# Patient Record
Sex: Female | Born: 1940 | Race: Black or African American | Hispanic: No | Marital: Single | Smoking: Current every day smoker
Health system: Southern US, Community
[De-identification: ages and names within clinical notes are randomized; demographics above are authoritative.]

## PROBLEM LIST (undated history)

## (undated) DIAGNOSIS — J449 Chronic obstructive pulmonary disease, unspecified: Secondary | ICD-10-CM

## (undated) DIAGNOSIS — C169 Malignant neoplasm of stomach, unspecified: Secondary | ICD-10-CM

## (undated) DIAGNOSIS — H25019 Cortical age-related cataract, unspecified eye: Secondary | ICD-10-CM

## (undated) DIAGNOSIS — Z86010 Personal history of colonic polyps: Secondary | ICD-10-CM

## (undated) DIAGNOSIS — N189 Chronic kidney disease, unspecified: Secondary | ICD-10-CM

## (undated) DIAGNOSIS — M1990 Unspecified osteoarthritis, unspecified site: Secondary | ICD-10-CM

## (undated) DIAGNOSIS — M654 Radial styloid tenosynovitis [de Quervain]: Secondary | ICD-10-CM

## (undated) DIAGNOSIS — C801 Malignant (primary) neoplasm, unspecified: Secondary | ICD-10-CM

## (undated) DIAGNOSIS — M4800 Spinal stenosis, site unspecified: Secondary | ICD-10-CM

## (undated) DIAGNOSIS — K859 Acute pancreatitis without necrosis or infection, unspecified: Secondary | ICD-10-CM

## (undated) DIAGNOSIS — N200 Calculus of kidney: Secondary | ICD-10-CM

## (undated) DIAGNOSIS — D649 Anemia, unspecified: Secondary | ICD-10-CM

## (undated) HISTORY — PX: OTHER SURGICAL HISTORY: SHX169

## (undated) HISTORY — PX: ABDOMINAL HYSTERECTOMY: SHX81

## (undated) HISTORY — PX: BLADDER SURGERY: SHX569

## (undated) HISTORY — PX: WRIST SURGERY: SHX841

## (undated) HISTORY — PX: SHOULDER ACROMIOPLASTY: SHX6093

---

## 1987-07-07 DIAGNOSIS — C801 Malignant (primary) neoplasm, unspecified: Secondary | ICD-10-CM

## 1987-07-07 DIAGNOSIS — C169 Malignant neoplasm of stomach, unspecified: Secondary | ICD-10-CM

## 1987-07-07 HISTORY — DX: Malignant (primary) neoplasm, unspecified: C80.1

## 1987-07-07 HISTORY — DX: Malignant neoplasm of stomach, unspecified: C16.9

## 2005-01-15 ENCOUNTER — Emergency Department: Payer: Self-pay | Admitting: Emergency Medicine

## 2005-01-27 ENCOUNTER — Ambulatory Visit: Payer: Self-pay | Admitting: Family Medicine

## 2005-08-25 ENCOUNTER — Ambulatory Visit: Payer: Self-pay | Admitting: Family Medicine

## 2006-02-16 ENCOUNTER — Ambulatory Visit: Payer: Self-pay | Admitting: Family Medicine

## 2006-08-30 ENCOUNTER — Ambulatory Visit: Payer: Self-pay | Admitting: Family Medicine

## 2006-09-30 ENCOUNTER — Ambulatory Visit: Payer: Self-pay | Admitting: Unknown Physician Specialty

## 2007-01-10 ENCOUNTER — Other Ambulatory Visit: Payer: Self-pay

## 2007-01-10 ENCOUNTER — Inpatient Hospital Stay: Payer: Self-pay | Admitting: Internal Medicine

## 2007-12-20 ENCOUNTER — Ambulatory Visit: Payer: Self-pay | Admitting: Specialist

## 2008-02-17 ENCOUNTER — Other Ambulatory Visit: Payer: Self-pay

## 2008-02-17 ENCOUNTER — Ambulatory Visit: Payer: Self-pay | Admitting: Specialist

## 2008-02-28 ENCOUNTER — Ambulatory Visit: Payer: Self-pay | Admitting: Specialist

## 2009-01-01 ENCOUNTER — Ambulatory Visit: Payer: Self-pay | Admitting: Family Medicine

## 2009-02-14 ENCOUNTER — Encounter: Admission: RE | Admit: 2009-02-14 | Discharge: 2009-02-14 | Payer: Self-pay | Admitting: Neurosurgery

## 2009-12-05 ENCOUNTER — Ambulatory Visit: Payer: Self-pay | Admitting: Unknown Physician Specialty

## 2010-07-14 ENCOUNTER — Ambulatory Visit: Payer: Self-pay | Admitting: Family Medicine

## 2011-05-12 ENCOUNTER — Emergency Department: Payer: Self-pay | Admitting: Unknown Physician Specialty

## 2011-06-28 ENCOUNTER — Emergency Department: Payer: Self-pay | Admitting: Internal Medicine

## 2011-08-13 ENCOUNTER — Ambulatory Visit: Payer: Self-pay | Admitting: Family Medicine

## 2012-02-08 IMAGING — MG MM CAD SCREENING MAMMO
1 series · 4 of 4 positions shown · non-contrast
Comparison: 07/14/2010, 08/30/2006.

REASON FOR EXAM: SCR MAMMO NO ORDER
COMMENTS:

PROCEDURE:     MAM - MAM DGTL SCRN MAM NO ORDER W/CAD  - August 13, 2011  [DATE]
RESULT:

[R CC · right · 4 of 4 slices shown]
[im 1/4]
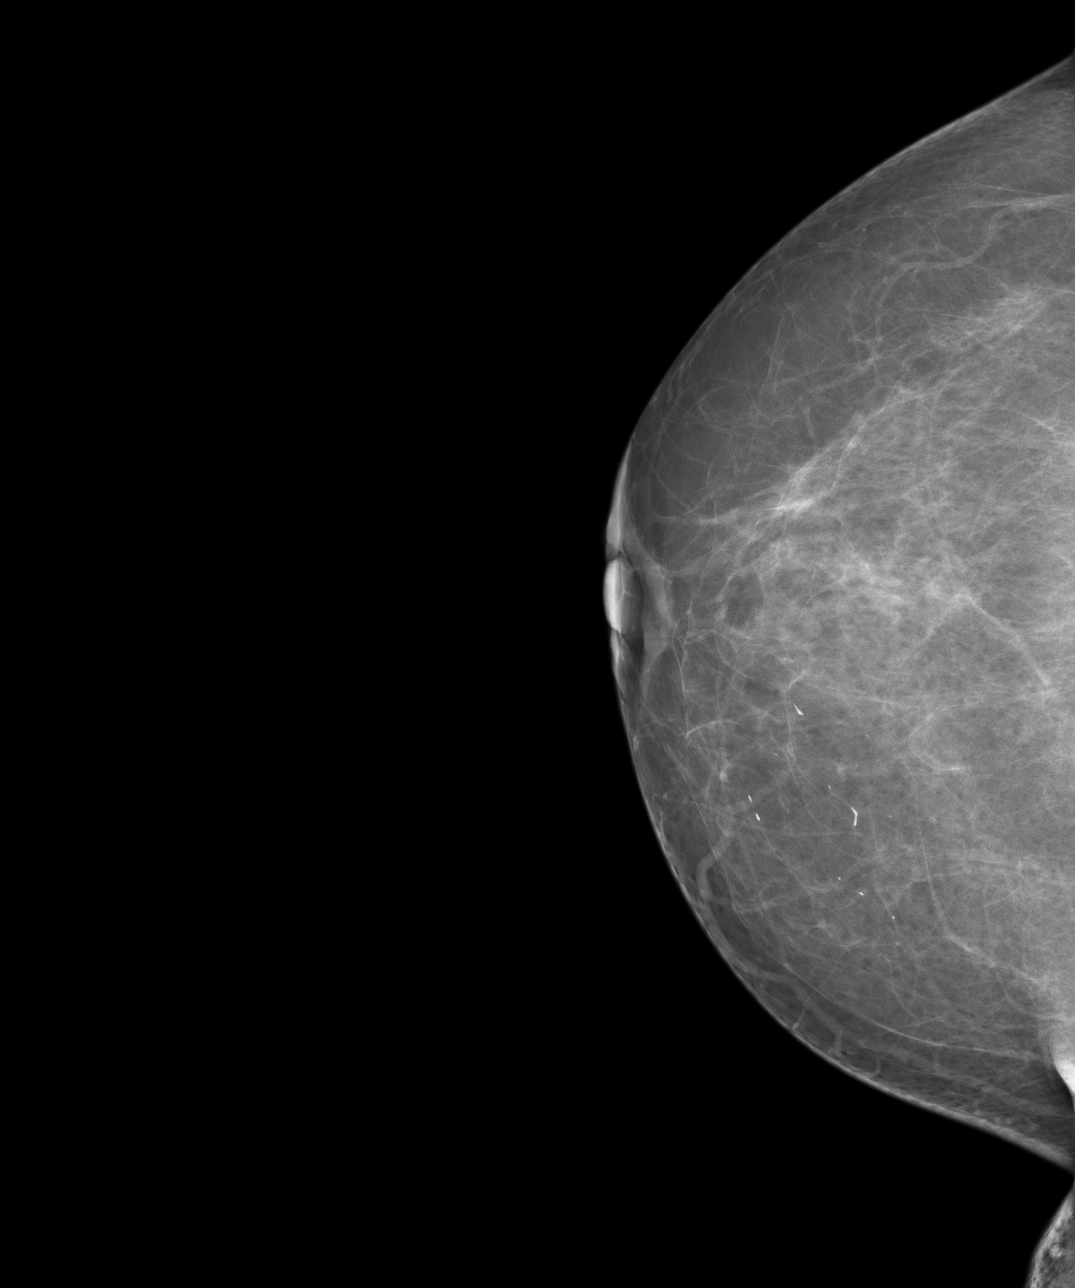
[im 2/4]
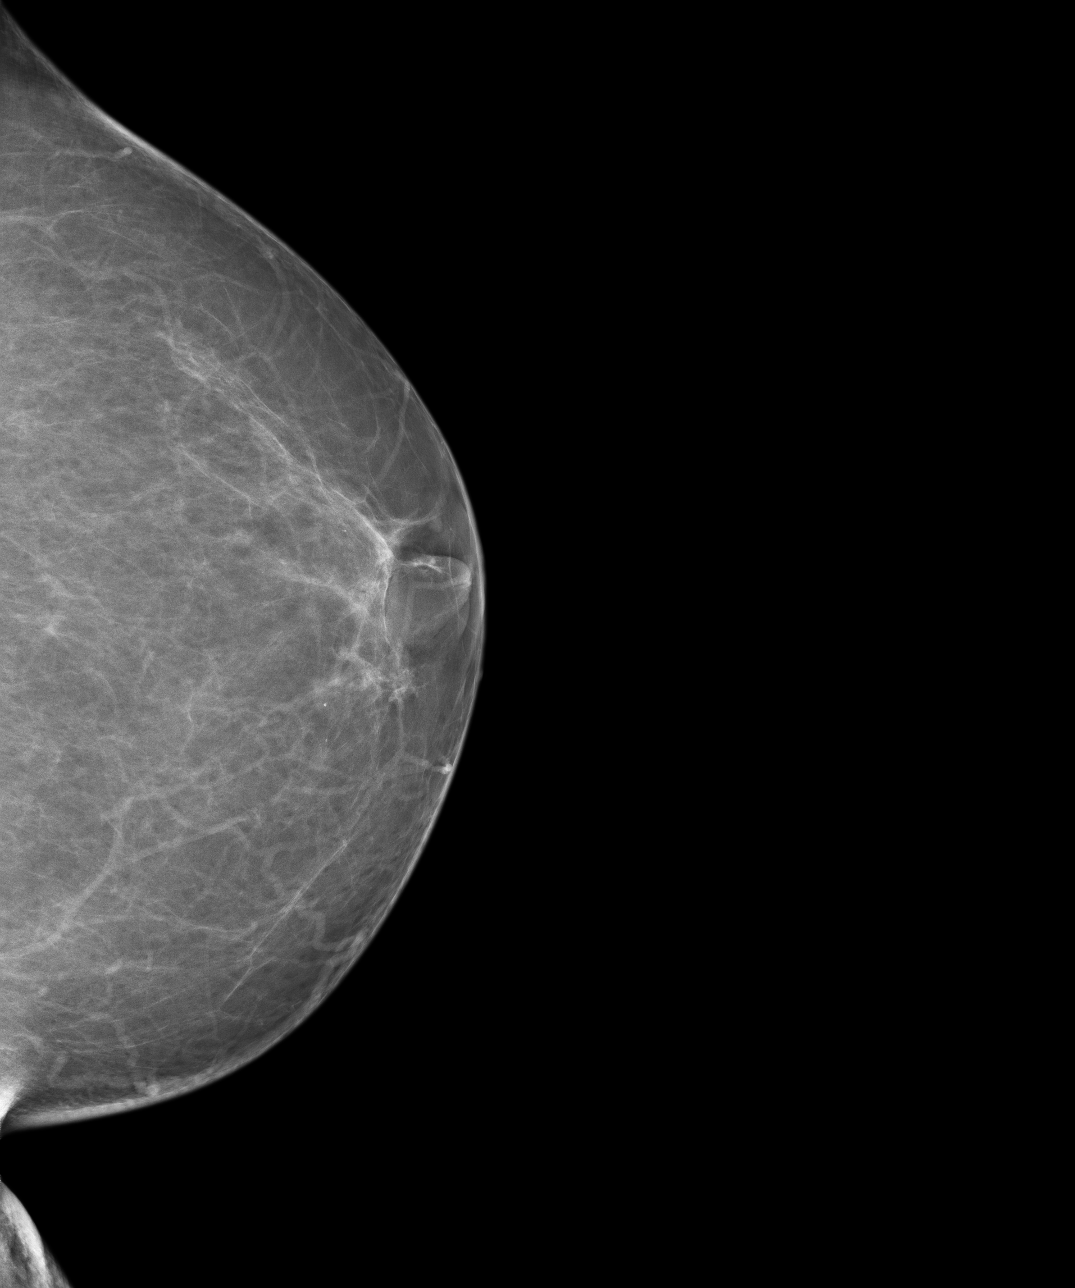
[im 3/4]
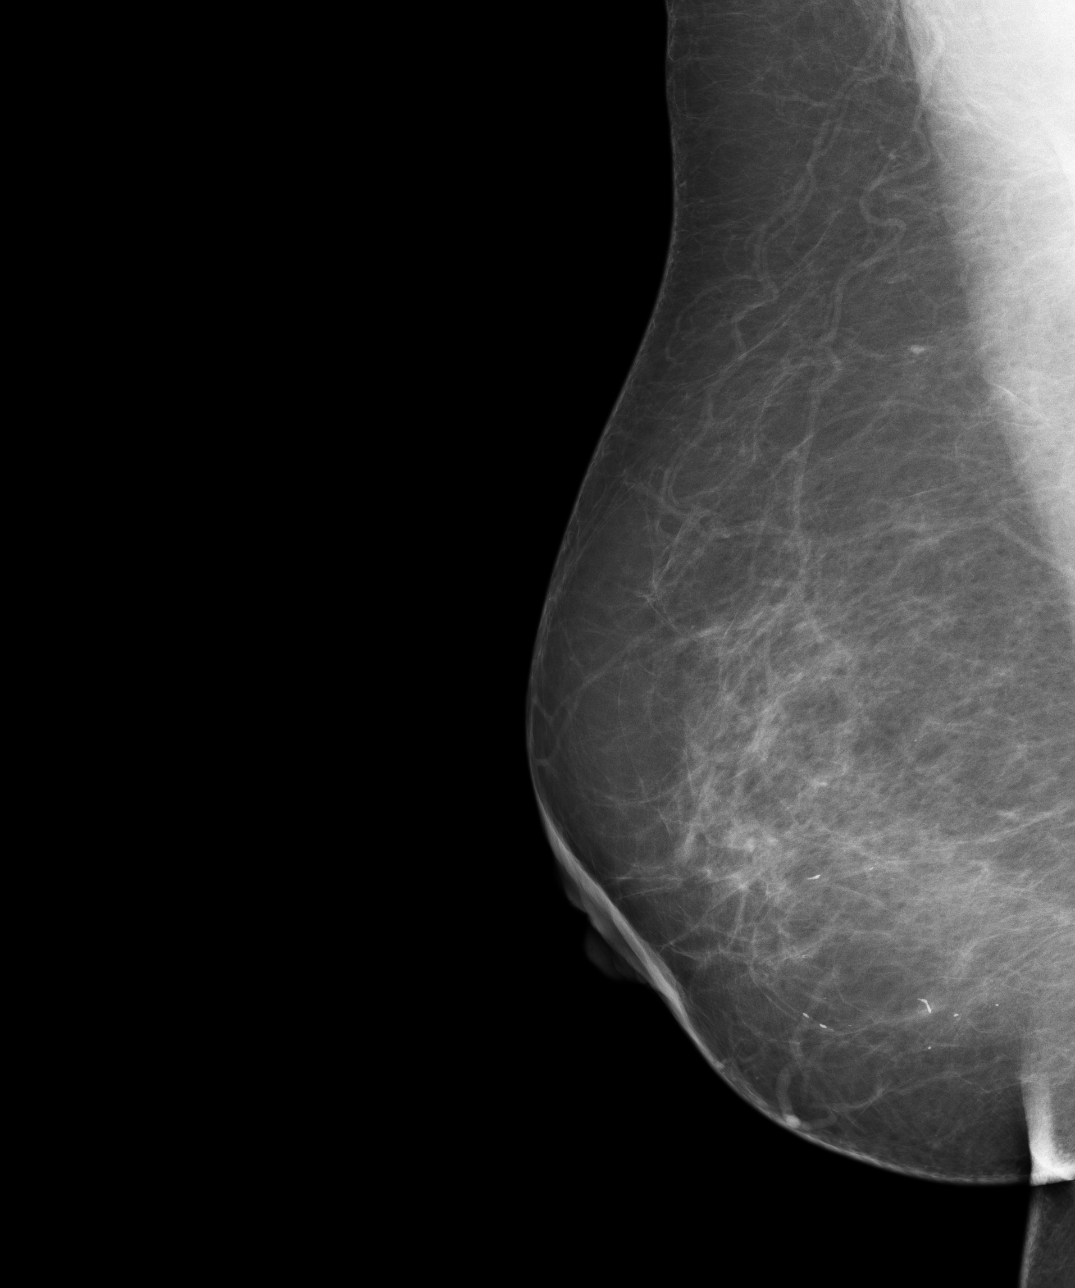
[im 4/4]
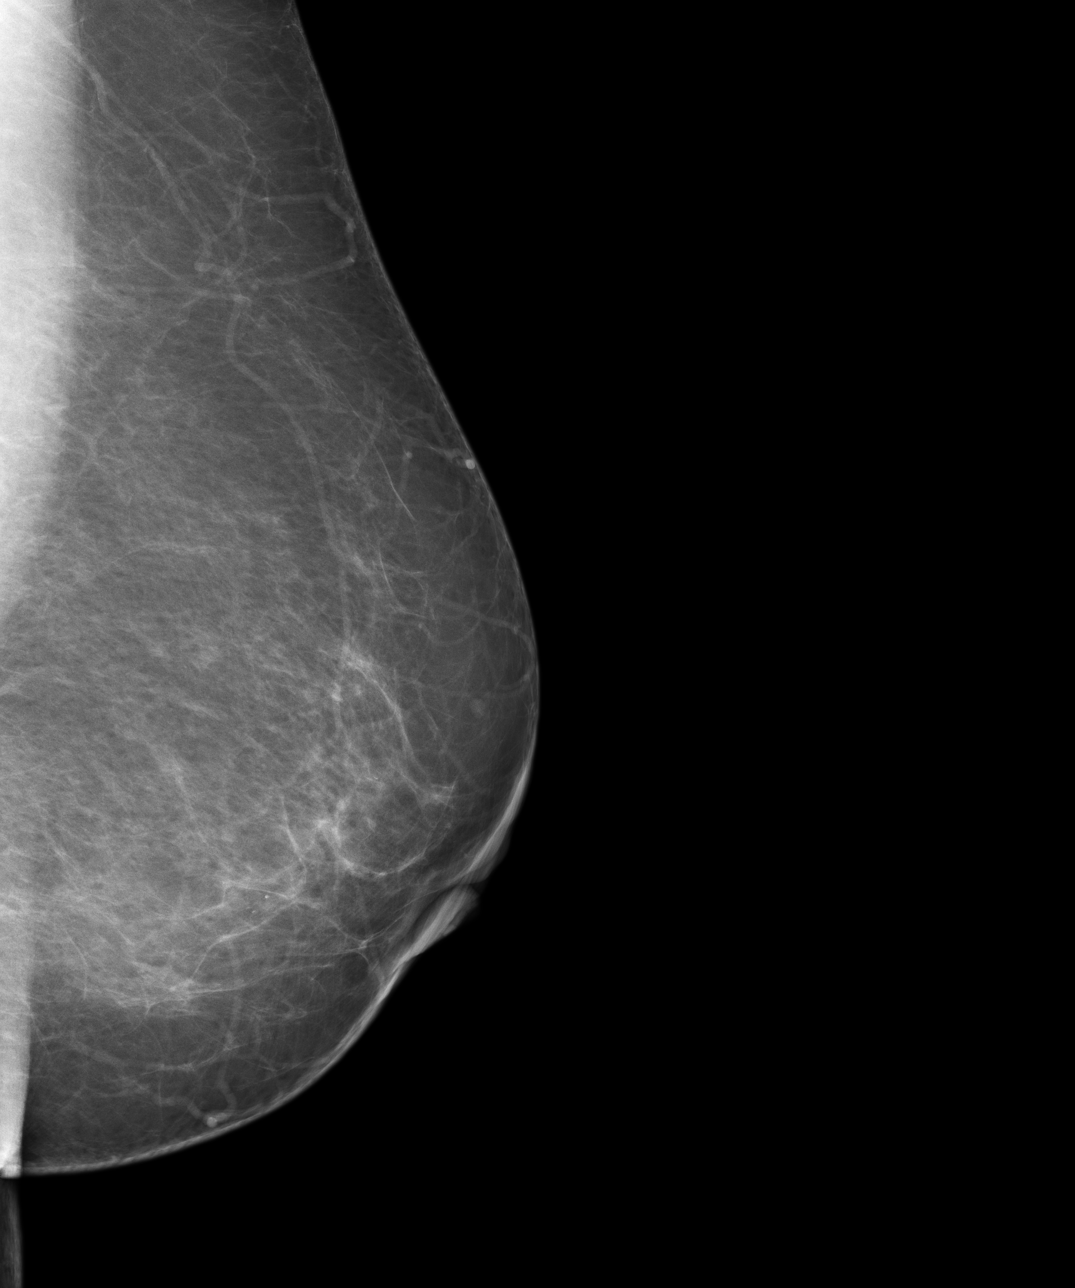

[4 of 4 positions shown; findings below may reference images not displayed]

FINDINGS: There is scattered fibroglandular tissue. No suspicious masses or
calcifications are identified. No areas of architectural distortion.
IMPRESSION: BI-RADS: Category 1 - Negative

RECOMMENDATION:  Continued annual screening mammography.

Thank you for this opportunity to contribute to the care of your patient.

A NEGATIVE MAMMOGRAM REPORT DOES NOT PRECLUDE BIOPSY OR OTHER EVALUATION OF
A CLINICALLY PALPABLE OR OTHERWISE SUSPICIOUS MASS OR LESION. BREAST CANCER
MAY NOT BE DETECTED BY MAMMOGRAPHY IN UP TO 10% OF CASES.

## 2012-04-10 ENCOUNTER — Emergency Department: Payer: Self-pay | Admitting: Emergency Medicine

## 2012-04-10 LAB — CBC
Platelet: 319 10*3/uL (ref 150–440)
RBC: 3.85 10*6/uL (ref 3.80–5.20)
WBC: 6.3 10*3/uL (ref 3.6–11.0)

## 2012-04-10 LAB — BASIC METABOLIC PANEL
Anion Gap: 9 (ref 7–16)
BUN: 14 mg/dL (ref 7–18)
Chloride: 112 mmol/L — ABNORMAL HIGH (ref 98–107)
Co2: 23 mmol/L (ref 21–32)
Creatinine: 0.74 mg/dL (ref 0.60–1.30)
EGFR (African American): >60
Potassium: 3.7 mmol/L (ref 3.5–5.1)
Sodium: 144 mmol/L (ref 136–145)

## 2012-04-10 LAB — URINALYSIS, COMPLETE
Bacteria: NONE SEEN
Bilirubin,UR: NEGATIVE
Glucose,UR: NEGATIVE mg/dL (ref 0–75)
Specific Gravity: 1.025 (ref 1.003–1.030)
Squamous Epithelial: 4

## 2012-06-17 ENCOUNTER — Ambulatory Visit: Payer: Self-pay | Admitting: Urology

## 2012-08-15 ENCOUNTER — Ambulatory Visit: Payer: Self-pay | Admitting: Family Medicine

## 2012-09-14 ENCOUNTER — Ambulatory Visit: Payer: Self-pay | Admitting: Urology

## 2012-09-14 DIAGNOSIS — Z01810 Encounter for preprocedural cardiovascular examination: Secondary | ICD-10-CM

## 2012-09-14 LAB — HEMOGLOBIN: HGB: 9.5 g/dL — ABNORMAL LOW (ref 12.0–16.0)

## 2012-09-19 ENCOUNTER — Ambulatory Visit: Payer: Self-pay | Admitting: Urology

## 2012-09-21 LAB — PATHOLOGY REPORT

## 2012-12-22 ENCOUNTER — Ambulatory Visit: Payer: Self-pay | Admitting: Urology

## 2012-12-26 ENCOUNTER — Ambulatory Visit: Payer: Self-pay | Admitting: Urology

## 2012-12-29 LAB — PATHOLOGY REPORT

## 2013-07-03 ENCOUNTER — Emergency Department: Payer: Self-pay | Admitting: Emergency Medicine

## 2013-07-03 LAB — RAPID INFLUENZA A&B ANTIGENS

## 2013-07-04 LAB — BASIC METABOLIC PANEL
BUN: 16 mg/dL (ref 7–18)
Calcium, Total: 8.9 mg/dL (ref 8.5–10.1)
Chloride: 103 mmol/L (ref 98–107)
Creatinine: 0.6 mg/dL (ref 0.60–1.30)
Potassium: 3.7 mmol/L (ref 3.5–5.1)

## 2013-07-04 LAB — CBC
HGB: 10.6 g/dL — ABNORMAL LOW (ref 12.0–16.0)
MCH: 25.3 pg — ABNORMAL LOW (ref 26.0–34.0)
Platelet: 256 10*3/uL (ref 150–440)
RBC: 4.2 10*6/uL (ref 3.80–5.20)
WBC: 7.6 10*3/uL (ref 3.6–11.0)

## 2013-07-04 LAB — TROPONIN I: Troponin-I: 0.02 ng/mL

## 2014-10-26 NOTE — Op Note (Signed)
PATIENT NAME:  Jasmine, [name] MR#:  [id number] DATE OF BIRTH:  1941-03-20  DATE OF PROCEDURE:  12/26/2012  PREOPERATIVE DIAGNOSIS: Previous bladder tumor with biopsy of previous bladder tumor site for staging purposes.   POSTOPERATIVE DIAGNOSIS: Previous bladder tumor with biopsy of previous bladder tumor site for staging purposes.   PROCEDURE:  bladder biopsy and fulgaration  SURGEON: Lainy Wrobleski D. Elnoria Howard, DO  ANESTHESIA: General.   COMPLICATIONS: None.  DESCRIPTION OF PROCEDURE AND FINDINGS:  The patient was sterilely prepped and draped in supine lithotomy position for ease of approach to the external genitalia. I placed a 21-French sheath with a 4 oblique lens into the bladder.  Posteriorly, I found the location of the previous tumor which was into the lamina propria and very aggressive. There was no tumor left. At this time there was only scar.  Scar area was biopsied with a rigid biopsy and fulgurated with Bugbee electrode. Two biopsies were taken to the area of previous resection. These were fairly deep biopsies. So at the end of the procedure, the bladder had some nice clear urine, bladder was emptied, and 30 mL of 0.5% plain Marcaine was placed in the bladder.  Biopsy specimens were sent to pathology. The patient has a B and O suppository placed. She was sent to recovery in satisfactory condition.  ____________________________ Jasmine [name]. Elnoria Howard, DO rdh:sb D: 12/26/2012 13:57:47 ET T: 12/26/2012 15:29:11 ET JOB#: [id number]  cc: Jasmine [name]. Elnoria Howard, DO, <Dictator> Jasmine [name] D Jasmine Bertoni DO ELECTRONICALLY SIGNED 12/30/2012 16:16

## 2014-10-26 NOTE — Op Note (Signed)
NAME:  [name], Jasmine [name] MR#:  [id number] DATE OF BIRTH:  Nov 18, 1940  DATE OF PROCEDURE:  09/19/2012  TIME: 11:00 [name].m.  PREOPERATIVE DIAGNOSIS: Medium-sized bladder tumor, posterior wall.   POSTOPERATIVE DIAGNOSIS: Medium-sized bladder tumor, posterior wall.   PROCEDURE: Cystoscopy with transurethral resection of  [name] single bladder tumor, medium-sized.   ANESTHESIA: General.   COMPLICATIONS: None.   BLOOD LOSS: Minimal.   DESCRIPTION OF PROCEDURE: The patient was sterilely prepped and draped in the supine lithotomy position for ease of approach to the external genitalia. The procedure was begun.   [name] cystoscopy was done and [name] Foroblique lens examines the bladder. One medium-sized necrotic-appearing tumor with [name] small, narrow base was seen in the posterior wall. Initially, most of the tumor was resected with cold resection. Very low cautery was necessary. Once I see the base of the tumor, I resect that with [name] standard water solution and electrocautery with the 26-French [facility] resectoscope. There was no penetration through the bladder wall. No fat is seen. However, muscle is resected superficially. The base was cauterized. There were 2 arterial bleeders that were easily cauterized. Tumors evacuated with the base tumor evacuated separately from the bulky and necrotic tumor. The base was sent to Pathology in [name] separate container. Bladder was emptied of all tumor. Then, then I placed 30 mL of 0.5% plain Marcaine in the bladder. This was left in for 2 minutes, and [name] 20-French Foley catheter, which is [name] 2-way non-irrigating catheter, was placed in the bladder to gravity. Clear urine continues to be seen throughout, so she was sent to recovery in satisfactory condition for removal of this catheter in  48 hours.    ____________________________ Janice Coffin. Elnoria Howard, DO rdh:dm D: 09/19/2012 11:26:00 ET T: 09/19/2012 12:29:27 ET JOB#: [id number]  cc: Janice Coffin. Elnoria Howard, DO, <Dictator> Shahil Speegle D Candiss Galeana  DO ELECTRONICALLY SIGNED 10/20/2012 15:11

## 2015-01-15 ENCOUNTER — Emergency Department: Payer: Medicare Other

## 2015-01-15 ENCOUNTER — Emergency Department
Admission: EM | Admit: 2015-01-15 | Discharge: 2015-01-15 | Disposition: A | Discharge status: Home / Self Care | Payer: Medicare Other | Attending: Emergency Medicine | Admitting: Emergency Medicine

## 2015-01-15 ENCOUNTER — Encounter: Payer: Self-pay | Admitting: Emergency Medicine

## 2015-01-15 DIAGNOSIS — M1711 Unilateral primary osteoarthritis, right knee: Secondary | ICD-10-CM | POA: Diagnosis not present

## 2015-01-15 DIAGNOSIS — M7121 Synovial cyst of popliteal space [Baker], right knee: Secondary | ICD-10-CM | POA: Insufficient documentation

## 2015-01-15 DIAGNOSIS — Z79899 Other long term (current) drug therapy: Secondary | ICD-10-CM | POA: Insufficient documentation

## 2015-01-15 DIAGNOSIS — M25561 Pain in right knee: Secondary | ICD-10-CM | POA: Diagnosis present

## 2015-01-15 DIAGNOSIS — M109 Gout, unspecified: Secondary | ICD-10-CM

## 2015-01-15 DIAGNOSIS — M1000 Idiopathic gout, unspecified site: Secondary | ICD-10-CM | POA: Diagnosis not present

## 2015-01-15 HISTORY — DX: Chronic obstructive pulmonary disease, unspecified: J44.9

## 2015-01-15 MED ORDER — COLCHICINE 0.6 MG PO TABS
ORAL_TABLET | ORAL | Status: AC
  Administered 2015-01-15: 0.6 mg via ORAL
  Filled 2015-01-15: qty 1

## 2015-01-15 MED ORDER — COLCHICINE 0.6 MG PO TABS: 0.6000 mg | ORAL_TABLET | Freq: Two times a day (BID) | ORAL | Status: DC

## 2015-01-15 MED ORDER — COLCHICINE 0.6 MG PO TABS
0.6000 mg | ORAL_TABLET | Freq: Once | ORAL | Status: AC
  Administered 2015-01-15: 0.6 mg via ORAL

## 2015-01-15 NOTE — ED Notes (Signed)
Patient presents to the ED with right leg swelling and right knee pain since yesterday.  Both feet appear swollen but left foot is significantly more swollen than the right.  Patient denies any history of blood clots.  Patient denies any shortness of breath or difficulty breathing today.

## 2015-01-15 NOTE — ED Provider Notes (Signed)
[facility] Emergency Department Provider Note  ____________________________________________  Time seen: 1820   I have reviewed the triage vital signs and the nursing notes.   HISTORY  Chief Complaint Knee Pain and Leg Swelling     HPI Jasmine [name] is a 74 y.o. female who reports she began to have pain in her right knee yesterday.  She reports the knee feels swollen. It hurts to move the knee. The pain is primarily in the knee but she says the pain feels like it shoots down towards her foot.  She rates the pain at an 8.  The patient denies having previous symptoms similar to this. She does not have a history of blood clots or gout or osteoarthritis. She denies any fever. She denies any redness or warmth around the knee.    Past Medical History  Diagnosis Date  . COPD (chronic obstructive pulmonary disease)     There are no active problems to display for this patient.   Past Surgical History  Procedure Laterality Date  . Bladder surgery      Current Outpatient Rx  Name  Route  Sig  Dispense  Refill  . colchicine 0.6 MG tablet   Oral   Take 1 tablet (0.6 mg total) by mouth 2 (two) times daily.   10 tablet   0     Allergies Iohexol  History reviewed. No pertinent family history.  Social History History  Substance Use Topics  . Smoking status: Never Smoker   . Smokeless tobacco: Not on file  . Alcohol Use: No    Review of Systems  Constitutional: Negative for fever. ENT: Negative for sore throat. Cardiovascular: Negative for chest pain. Respiratory: Negative for shortness of breath. Gastrointestinal: Negative for abdominal pain, vomiting and diarrhea. Genitourinary: Negative for dysuria. Musculoskeletal: Pain and swelling to right knee. She history of present illness Skin: Negative for rash. Neurological: Negative for headaches   10-point ROS otherwise  negative.  ____________________________________________   PHYSICAL EXAM:  VITAL SIGNS: ED Triage Vitals  Enc Vitals Group     BP 01/15/15 1703 153/79 mmHg     Pulse Rate 01/15/15 1703 79     Resp 01/15/15 1703 20     Temp 01/15/15 1703 99 F (37.2 C)     Temp Source 01/15/15 1703 Oral     SpO2 01/15/15 1703 96 %     Weight 01/15/15 1703 175 lb (79.379 kg)     Height 01/15/15 1703 5\' 4"  (1.626 m)     Head Cir --      Peak Flow --      Pain Score 01/15/15 1704 8     Pain Loc --      Pain Edu? --      Excl. in [place]? --     Constitutional: Alert and oriented. Pleasant and interactive. Discomfort with movement of the right. Cardiovascular: Normal rate, regular rhythm, no murmur noted Respiratory:  Normal respiratory effort, no tachypnea.    Breath sounds are clear and equal bilaterally.  Gastrointestinal: Soft and nontender. No distention.  Back: No muscle spasm, no tenderness, no CVA tenderness. Musculoskeletal: The right knee is somewhat swollen with a mild effusion noted. There is no redness or warmth. There is pain with passive or active motion. There is edema in both lower extremities, but this appears mild in the left and worse in the right. Neurologic:  Normal speech and language. No gross focal neurologic deficits are appreciated.  Skin:  Skin  is warm, dry. No rash noted. Psychiatric: Mood and affect are normal. Speech and behavior are normal.  ____________________________________________  ____________________________________________    RADIOLOGY  Ultrasound, Doppler, right leg: IMPRESSION: No acute fracture or subluxation. Diffuse osteopenia. Osteoarthritic changes as described above. Small joint effusion.   X-ray, right knee:  IMPRESSION: No acute fracture or subluxation. Diffuse osteopenia. Osteoarthritic changes as described above. Small joint effusion.    ____________________________________________  INITIAL IMPRESSION / ASSESSMENT AND PLAN / ED  COURSE  Pertinent labs & imaging results that were available during my care of the patient were reviewed by me and considered in my medical decision making (see chart for details).  I've reviewed the nurse's note from triage. His speaks of swelling in both feet, which is present, but also speaks of the left foot being significantly more swollen than the right, which I do not find on my exam or during my history.  ----------------------------------------- 8:03 PM on 01/15/2015 -----------------------------------------  Patient's ultrasound is negative for DVT. An x-ray was performed which showed no acute fracture or subluxation. She does have osteoarthritic changes with a small joint effusion. I do not think this small joint effusion would be easily tapped into. I discussed making an attempt to get additional laboratory information about the cause of this patient's knee pain. I suspect that it is gout area patient is declining arthrocentesis. I will treat her with colchicine. I will ask that she follow-up with her regular doctor for ongoing care.   ____________________________________________   FINAL CLINICAL IMPRESSION(S) / ED DIAGNOSES  Final diagnoses:  Osteoarthritis of right knee, unspecified osteoarthritis type  Gouty arthritis  Baker's cyst, right knee    Ahmed Prima, MD 01/15/15 2017

## 2015-01-15 NOTE — Discharge Instructions (Signed)
You have pain that is likely from gout or osteoarthritis. You may continue to take your low-dose ibuprofen as you have. You may also try colchicine as discussed. Follow-up with your regular doctor. Return to the emergency department if you have redness, warmth, increasing pain, or other urgent concerns.   Arthritis, Nonspecific Arthritis is inflammation of a joint. This usually means pain, redness, warmth or swelling are present. One or more joints may be involved. There are a number of types of arthritis. Your caregiver may not be able to tell what type of arthritis you have right away. CAUSES  The most common cause of arthritis is the wear and tear on the joint (osteoarthritis). This causes damage to the cartilage, which can break down over time. The knees, hips, back and neck are most often affected by this type of arthritis. Other types of arthritis and common causes of joint pain include:  Sprains and other injuries near the joint. Sometimes minor sprains and injuries cause pain and swelling that develop hours later.  Rheumatoid arthritis. This affects hands, feet and knees. It usually affects both sides of your body at the same time. It is often associated with chronic ailments, fever, weight loss and general weakness.  Crystal arthritis. Gout and pseudo gout can cause occasional acute severe pain, redness and swelling in the foot, ankle, or knee.  Infectious arthritis. Bacteria can get into a joint through a break in overlying skin. This can cause infection of the joint. Bacteria and viruses can also spread through the blood and affect your joints.  Drug, infectious and allergy reactions. Sometimes joints can become mildly painful and slightly swollen with these types of illnesses. SYMPTOMS   Pain is the main symptom.  Your joint or joints can also be red, swollen and warm or hot to the touch.  You may have a fever with certain types of arthritis, or even feel overall ill.  The  joint with arthritis will hurt with movement. Stiffness is present with some types of arthritis. DIAGNOSIS  Your caregiver will suspect arthritis based on your description of your symptoms and on your exam. Testing may be needed to find the type of arthritis:  Blood and sometimes urine tests.  X-ray tests and sometimes CT or MRI scans.  Removal of fluid from the joint (arthrocentesis) is done to check for bacteria, crystals or other causes. Your caregiver (or a specialist) will numb the area over the joint with a local anesthetic, and use a needle to remove joint fluid for examination. This procedure is only minimally uncomfortable.  Even with these tests, your caregiver may not be able to tell what kind of arthritis you have. Consultation with a specialist (rheumatologist) may be helpful. TREATMENT  Your caregiver will discuss with you treatment specific to your type of arthritis. If the specific type cannot be determined, then the following general recommendations may apply. Treatment of severe joint pain includes:  Rest.  Elevation.  Anti-inflammatory medication (for example, ibuprofen) may be prescribed. Avoiding activities that cause increased pain.  Only take over-the-counter or prescription medicines for pain and discomfort as recommended by your caregiver.  Cold packs over an inflamed joint may be used for 10 to 15 minutes every hour. Hot packs sometimes feel better, but do not use overnight. Do not use hot packs if you are diabetic without your caregiver's permission.  A cortisone shot into arthritic joints may help reduce pain and swelling.  Any acute arthritis that gets worse over the next 1  to 2 days needs to be looked at to be sure there is no joint infection. Long-term arthritis treatment involves modifying activities and lifestyle to reduce joint stress jarring. This can include weight loss. Also, exercise is needed to nourish the joint cartilage and remove waste. This  helps keep the muscles around the joint strong. HOME CARE INSTRUCTIONS   Do not take aspirin to relieve pain if gout is suspected. This elevates uric acid levels.  Only take over-the-counter or prescription medicines for pain, discomfort or fever as directed by your caregiver.  Rest the joint as much as possible.  If your joint is swollen, keep it elevated.  Use crutches if the painful joint is in your leg.  Drinking plenty of fluids may help for certain types of arthritis.  Follow your caregiver's dietary instructions.  Try low-impact exercise such as:  Swimming.  Water aerobics.  Biking.  Walking.  Morning stiffness is often relieved by a warm shower.  Put your joints through regular range-of-motion. SEEK MEDICAL CARE IF:   You do not feel better in 24 hours or are getting worse.  You have side effects to medications, or are not getting better with treatment. SEEK IMMEDIATE MEDICAL CARE IF:   You have a fever.  You develop severe joint pain, swelling or redness.  Many joints are involved and become painful and swollen.  There is severe back pain and/or leg weakness.  You have loss of bowel or bladder control. Document Released: 07/30/2004 Document Revised: 09/14/2011 Document Reviewed: 08/15/2008 [facility] Patient Information 2015 [place], [place]. This information is not intended to replace advice given to you by your health care provider. Make sure you discuss any questions you have with your health care provider.  Gout Gout is an inflammatory arthritis caused by a buildup of uric acid crystals in the joints. Uric acid is a chemical that is normally present in the blood. When the level of uric acid in the blood is too high it can form crystals that deposit in your joints and tissues. This causes joint redness, soreness, and swelling (inflammation). Repeat attacks are common. Over time, uric acid crystals can form into masses (tophi) near a joint, destroying bone  and causing disfigurement. Gout is treatable and often preventable. CAUSES  The disease begins with elevated levels of uric acid in the blood. Uric acid is produced by your body when it breaks down a naturally found substance called purines. Certain foods you eat, such as meats and fish, contain high amounts of purines. Causes of an elevated uric acid level include:  Being passed down from parent to child (heredity).  Diseases that cause increased uric acid production (such as obesity, psoriasis, and certain cancers).  Excessive alcohol use.  Diet, especially diets rich in meat and seafood.  Medicines, including certain cancer-fighting medicines (chemotherapy), water pills (diuretics), and aspirin.  Chronic kidney disease. The kidneys are no longer able to remove uric acid well.  Problems with metabolism. Conditions strongly associated with gout include:  Obesity.  High blood pressure.  High cholesterol.  Diabetes. Not everyone with elevated uric acid levels gets gout. It is not understood why some people get gout and others do not. Surgery, joint injury, and eating too much of certain foods are some of the factors that can lead to gout attacks. SYMPTOMS   An attack of gout comes on quickly. It causes intense pain with redness, swelling, and warmth in a joint.  Fever can occur.  Often, only one joint is involved.  Certain joints are more commonly involved:  Base of the big toe.  Knee.  Ankle.  Wrist.  Finger. Without treatment, an attack usually goes away in a few days to weeks. Between attacks, you usually will not have symptoms, which is different from many other forms of arthritis. DIAGNOSIS  Your caregiver will suspect gout based on your symptoms and exam. In some cases, tests may be recommended. The tests may include:  Blood tests.  Urine tests.  X-rays.  Joint fluid exam. This exam requires a needle to remove fluid from the joint (arthrocentesis). Using a  microscope, gout is confirmed when uric acid crystals are seen in the joint fluid. TREATMENT  There are two phases to gout treatment: treating the sudden onset (acute) attack and preventing attacks (prophylaxis).  Treatment of an Acute Attack.  Medicines are used. These include anti-inflammatory medicines or steroid medicines.  An injection of steroid medicine into the affected joint is sometimes necessary.  The painful joint is rested. Movement can worsen the arthritis.  You may use warm or cold treatments on painful joints, depending which works best for you.  Treatment to Prevent Attacks.  If you suffer from frequent gout attacks, your caregiver may advise preventive medicine. These medicines are started after the acute attack subsides. These medicines either help your kidneys eliminate uric acid from your body or decrease your uric acid production. You may need to stay on these medicines for a very long time.  The early phase of treatment with preventive medicine can be associated with an increase in acute gout attacks. For this reason, during the first few months of treatment, your caregiver may also advise you to take medicines usually used for acute gout treatment. Be sure you understand your caregiver's directions. Your caregiver may make several adjustments to your medicine dose before these medicines are effective.  Discuss dietary treatment with your caregiver or dietitian. Alcohol and drinks high in sugar and fructose and foods such as meat, poultry, and seafood can increase uric acid levels. Your caregiver or dietitian can advise you on drinks and foods that should be limited. HOME CARE INSTRUCTIONS   Do not take aspirin to relieve pain. This raises uric acid levels.  Only take over-the-counter or prescription medicines for pain, discomfort, or fever as directed by your caregiver.  Rest the joint as much as possible. When in bed, keep sheets and blankets off painful  areas.  Keep the affected joint raised (elevated).  Apply warm or cold treatments to painful joints. Use of warm or cold treatments depends on which works best for you.  Use crutches if the painful joint is in your leg.  Drink enough fluids to keep your urine clear or pale yellow. This helps your body get rid of uric acid. Limit alcohol, sugary drinks, and fructose drinks.  Follow your dietary instructions. Pay careful attention to the amount of protein you eat. Your daily diet should emphasize fruits, vegetables, whole grains, and fat-free or low-fat milk products. Discuss the use of coffee, vitamin C, and cherries with your caregiver or dietitian. These may be helpful in lowering uric acid levels.  Maintain a healthy body weight. SEEK MEDICAL CARE IF:   You develop diarrhea, vomiting, or any side effects from medicines.  You do not feel better in 24 hours, or you are getting worse. SEEK IMMEDIATE MEDICAL CARE IF:   Your joint becomes suddenly more tender, and you have chills or a fever. MAKE SURE YOU:   Understand these instructions.  Will watch your condition.  Will get help right away if you are not doing well or get worse. Document Released: 06/19/2000 Document Revised: 11/06/2013 Document Reviewed: 02/03/2012 [facility] Patient Information 2015 [place], [place]. This information is not intended to replace advice given to you by your health care provider. Make sure you discuss any questions you have with your health care provider.

## 2015-01-15 NOTE — ED Notes (Signed)
Spoke to Dr. Edd Fabian regarding patient's condition.  Orders received.

## 2015-02-19 ENCOUNTER — Encounter: Payer: Self-pay | Admitting: *Deleted

## 2015-02-20 ENCOUNTER — Encounter
Admission: RE | Disposition: A | Discharge status: Home / Self Care | Payer: Self-pay | Source: Ambulatory Visit | Attending: Unknown Physician Specialty

## 2015-02-20 ENCOUNTER — Ambulatory Visit
Admission: RE | Admit: 2015-02-20 | Discharge: 2015-02-20 | Disposition: A | Discharge status: Home / Self Care | Payer: Medicare Other | Source: Ambulatory Visit | Attending: Unknown Physician Specialty | Admitting: Unknown Physician Specialty

## 2015-02-20 ENCOUNTER — Ambulatory Visit: Payer: Medicare Other | Admitting: Anesthesiology

## 2015-02-20 ENCOUNTER — Encounter: Payer: Self-pay | Admitting: Anesthesiology

## 2015-02-20 DIAGNOSIS — Z08 Encounter for follow-up examination after completed treatment for malignant neoplasm: Secondary | ICD-10-CM | POA: Diagnosis not present

## 2015-02-20 DIAGNOSIS — D123 Benign neoplasm of transverse colon: Secondary | ICD-10-CM | POA: Diagnosis not present

## 2015-02-20 DIAGNOSIS — N189 Chronic kidney disease, unspecified: Secondary | ICD-10-CM | POA: Diagnosis not present

## 2015-02-20 DIAGNOSIS — Z86010 Personal history of colonic polyps: Secondary | ICD-10-CM | POA: Diagnosis not present

## 2015-02-20 DIAGNOSIS — K640 First degree hemorrhoids: Secondary | ICD-10-CM | POA: Diagnosis not present

## 2015-02-20 DIAGNOSIS — Z85028 Personal history of other malignant neoplasm of stomach: Secondary | ICD-10-CM | POA: Diagnosis present

## 2015-02-20 DIAGNOSIS — D127 Benign neoplasm of rectosigmoid junction: Secondary | ICD-10-CM | POA: Insufficient documentation

## 2015-02-20 DIAGNOSIS — Z87442 Personal history of urinary calculi: Secondary | ICD-10-CM | POA: Diagnosis not present

## 2015-02-20 DIAGNOSIS — D125 Benign neoplasm of sigmoid colon: Secondary | ICD-10-CM | POA: Insufficient documentation

## 2015-02-20 DIAGNOSIS — J449 Chronic obstructive pulmonary disease, unspecified: Secondary | ICD-10-CM | POA: Diagnosis not present

## 2015-02-20 DIAGNOSIS — F17210 Nicotine dependence, cigarettes, uncomplicated: Secondary | ICD-10-CM | POA: Diagnosis not present

## 2015-02-20 DIAGNOSIS — M1990 Unspecified osteoarthritis, unspecified site: Secondary | ICD-10-CM | POA: Insufficient documentation

## 2015-02-20 DIAGNOSIS — K2970 Gastritis, unspecified, without bleeding: Secondary | ICD-10-CM | POA: Diagnosis not present

## 2015-02-20 DIAGNOSIS — Z79899 Other long term (current) drug therapy: Secondary | ICD-10-CM | POA: Insufficient documentation

## 2015-02-20 DIAGNOSIS — D122 Benign neoplasm of ascending colon: Secondary | ICD-10-CM | POA: Insufficient documentation

## 2015-02-20 HISTORY — DX: Chronic kidney disease, unspecified: N18.9

## 2015-02-20 HISTORY — DX: Acute pancreatitis without necrosis or infection, unspecified: K85.90

## 2015-02-20 HISTORY — DX: Malignant neoplasm of stomach, unspecified: C16.9

## 2015-02-20 HISTORY — DX: Malignant (primary) neoplasm, unspecified: C80.1

## 2015-02-20 HISTORY — DX: Spinal stenosis, site unspecified: M48.00

## 2015-02-20 HISTORY — DX: Calculus of kidney: N20.0

## 2015-02-20 HISTORY — DX: Anemia, unspecified: D64.9

## 2015-02-20 HISTORY — DX: Unspecified osteoarthritis, unspecified site: M19.90

## 2015-02-20 HISTORY — PX: ESOPHAGOGASTRODUODENOSCOPY: SHX5428

## 2015-02-20 HISTORY — PX: COLONOSCOPY WITH PROPOFOL: SHX5780

## 2015-02-20 HISTORY — DX: Cortical age-related cataract, unspecified eye: H25.019

## 2015-02-20 HISTORY — DX: Radial styloid tenosynovitis (de quervain): M65.4

## 2015-02-20 SURGERY — COLONOSCOPY WITH PROPOFOL
Anesthesia: General

## 2015-02-20 MED ORDER — SODIUM CHLORIDE 0.9 % IV SOLN
INTRAVENOUS | Status: DC
Start: 2015-02-20 — End: 2015-02-20
  Administered 2015-02-20: 12:00:00 via INTRAVENOUS

## 2015-02-20 MED ORDER — PROPOFOL INFUSION 10 MG/ML OPTIME
INTRAVENOUS | Status: DC | PRN
Start: 2015-02-20 — End: 2015-02-20
  Administered 2015-02-20: 100 ug/kg/min via INTRAVENOUS

## 2015-02-20 MED ORDER — SODIUM CHLORIDE 0.9 % IV SOLN
INTRAVENOUS | Status: DC
  Administered 2015-02-20: 11:00:00 via INTRAVENOUS

## 2015-02-20 MED ORDER — MIDAZOLAM HCL 2 MG/2ML IJ SOLN
INTRAMUSCULAR | Status: DC | PRN
  Administered 2015-02-20: 2 mg via INTRAVENOUS

## 2015-02-20 MED ORDER — PROPOFOL 10 MG/ML IV BOLUS
INTRAVENOUS | Status: DC | PRN
  Administered 2015-02-20: 40 mg via INTRAVENOUS

## 2015-02-20 MED ORDER — LIDOCAINE HCL (CARDIAC) 20 MG/ML IV SOLN
INTRAVENOUS | Status: DC | PRN
  Administered 2015-02-20: 60 mg via INTRAVENOUS

## 2015-02-20 NOTE — Op Note (Signed)
[facility] Gastroenterology Patient Name: Jasmine [name] Procedure Date: 02/20/2015 11:50 AM MRN: [id number] Account #: [id number] Date of Birth: 1940-10-26 Admit Type: Outpatient Age: 74 Room: [facility] ENDO ROOM 3 Gender: Female Note Status: Finalized Procedure:         Colonoscopy Indications:       Personal history of colonic polyps Providers:         Manya Silvas, MD Referring MD:      Youlanda Roys. Lovie Macadamia, MD (Referring MD) Medicines:         Propofol per Anesthesia Complications:     No immediate complications. Procedure:         Pre-Anesthesia Assessment:                    - After reviewing the risks and benefits, the patient was                     deemed in satisfactory condition to undergo the procedure.                    After obtaining informed consent, the colonoscope was                     passed under direct vision. Throughout the procedure, the                     patient's blood pressure, pulse, and oxygen saturations                     were monitored continuously. The Colonoscope was                     introduced through the anus and advanced to the the cecum,                     identified by appendiceal orifice and ileocecal valve. The                     colonoscopy was performed without difficulty. The patient                     tolerated the procedure well. The quality of the bowel                     preparation was good. Findings:      A 6 mm polyp was found in the sigmoid colon. The polyp was sessile. The       polyp was removed with a hot snare. Resection and retrieval were       complete.      A small polyp was found in the proximal ascending colon. The polyp was       sessile. The polyp was removed with a jumbo cold forceps. Resection and       retrieval were complete.      Four sessile polyps were found at the hepatic flexure. The polyps were       small in size. These polyps were removed with a hot snare. Resection and     retrieval were complete. To prevent bleeding after the polypectomy, two       hemostatic clips were successfully placed.      A diminutive polyp was found in the recto-sigmoid colon. The polyp was       sessile. The polyp was removed with a jumbo  cold forceps. Resection and       retrieval were complete.      Internal hemorrhoids were found during endoscopy. The hemorrhoids were       small and Grade I (internal hemorrhoids that do not prolapse). Impression:        - One 6 mm polyp in the sigmoid colon. Resected and                     retrieved.                    - One small polyp in the proximal ascending colon.                     Resected and retrieved.                    - Four small polyps at the hepatic flexure. Resected and                     retrieved. Clips were placed.                    - One diminutive polyp at the recto-sigmoid colon.                     Resected and retrieved.                    - Internal hemorrhoids. Recommendation:    - Await pathology results. Manya Silvas, MD 02/20/2015 1:19:41 PM This report has been signed electronically. Number of Addenda: 0 Note Initiated On: 02/20/2015 11:50 AM Scope Withdrawal Time: 0 hours 34 minutes 8 seconds  Total Procedure Duration: 0 hours 37 minutes 49 seconds       [facility]

## 2015-02-20 NOTE — Transfer of Care (Signed)
Immediate Anesthesia Transfer of Care Note  Patient: Jasmine [name]  Procedure(s) Performed: Procedure(s): COLONOSCOPY WITH PROPOFOL (N/A) ESOPHAGOGASTRODUODENOSCOPY (EGD)  Patient Location: Endoscopy Unit  Anesthesia Type:General  Level of Consciousness: awake, alert , oriented and patient cooperative  Airway & Oxygen Therapy: Patient Spontanous Breathing and Patient connected to nasal cannula oxygen  Post-op Assessment: Report given to RN, Post -op Vital signs reviewed and stable and Patient moving all extremities X 4  Post vital signs: Reviewed and stable  Last Vitals:  Filed Vitals:   02/20/15 1256  BP: 138/61  Pulse: 72  Temp: 36.4 C  Resp: 23    Complications: No apparent anesthesia complications

## 2015-02-20 NOTE — Anesthesia Postprocedure Evaluation (Signed)
  Anesthesia Post-op Note  Patient: Jasmine [name]  Procedure(s) Performed: Procedure(s): COLONOSCOPY WITH PROPOFOL (N/A) ESOPHAGOGASTRODUODENOSCOPY (EGD)  Anesthesia type:General  Patient location: PACU  Post pain: Pain level controlled  Post assessment: Post-op Vital signs reviewed, Patient's Cardiovascular Status Stable, Respiratory Function Stable, Patent Airway and No signs of Nausea or vomiting  Post vital signs: Reviewed and stable  Last Vitals:  Filed Vitals:   02/20/15 1320  BP: 141/68  Pulse: 64  Temp:   Resp: 18    Level of consciousness: awake, alert  and patient cooperative  Complications: No apparent anesthesia complications

## 2015-02-20 NOTE — Op Note (Signed)
[facility] Gastroenterology Patient Name: Jasmine [name] Procedure Date: 02/20/2015 11:52 AM MRN: [id number] Account #: [id number] Date of Birth: [birth year] Admit Type: Outpatient Age: 74 Room: [facility] ENDO ROOM 3 Gender: Female Note Status: Finalized Procedure:         Upper GI endoscopy Indications:       Follow up gastric cancer Providers:         Manya Silvas, MD Referring MD:      Youlanda Roys. Lovie Macadamia, MD (Referring MD) Medicines:         Propofol per Anesthesia Complications:     No immediate complications. Procedure:         Pre-Anesthesia Assessment:                    - After reviewing the risks and benefits, the patient was                     deemed in satisfactory condition to undergo the procedure.                    After obtaining informed consent, the endoscope was passed                     under direct vision. Throughout the procedure, the                     patient's blood pressure, pulse, and oxygen saturations                     were monitored continuously. The Endoscope was introduced                     through the mouth, and advanced to the second part of                     duodenum. The upper GI endoscopy was accomplished without                     difficulty. The patient tolerated the procedure well. Findings:      The examined esophagus was normal.      Patchy mildly erythematous mucosa without bleeding was found in the       gastric body. No sign of any cancer or growths      The first part of the duodenum was normal. Impression:        - Normal esophagus.                    - Erythematous mucosa in the gastric body.                    - Normal first part of the duodenum.                    - No specimens collected. Recommendation:    - Perform a colonoscopy as previously scheduled. Manya Silvas, MD 02/20/2015 12:01:44 PM This report has been signed electronically. Number of Addenda: 0 Note Initiated On: 02/20/2015 11:52  AM      [facility]

## 2015-02-20 NOTE — H&P (Signed)
   Primary Care Physician:  Jasmine Pitch, MD Primary Gastroenterologist:  Dr. Vira Agar  Pre-Procedure History & Physical: HPI:  Jasmine [name] is a 74 y.o. female is here for an endoscopy and colonoscopy.   Past Medical History  Diagnosis Date  . COPD (chronic obstructive pulmonary disease)   . Anemia   . Chronic kidney disease   . Arthritis   . Cancer 1989  . Stomach cancer 1989  . Pancreatitis   . Spinal stenosis   . Cataract cortical, senile   . Nephrolithiasis   . De Quervain's tenosynovitis, bilateral     Past Surgical History  Procedure Laterality Date  . Bladder surgery    . Shoulder acromioplasty    . Lt.knee surgery    . Wrist surgery    . Kidney stone removed    . Abdominal hysterectomy    . Billroth 1 hemigastrectomy      Prior to Admission medications   Medication Sig Start Date End Date Taking? Authorizing Provider  albuterol (PROVENTIL HFA;VENTOLIN HFA) 108 (90 BASE) MCG/ACT inhaler Inhale 2 puffs into the lungs every 4 (four) hours as needed for wheezing or shortness of breath.   Yes Historical Provider, MD  Umeclidinium-Vilanterol 62.5-25 MCG/INH AEPB Inhale 1 spray into the lungs every morning.   Yes Historical Provider, MD  colchicine 0.6 MG tablet Take 1 tablet (0.6 mg total) by mouth 2 (two) times daily. 01/15/15   Ahmed Prima, MD    Allergies as of 01/03/2015 - reviewed 02/07/2009  Allergen Reaction Noted  . Iohexol  02/07/2009    History reviewed. No pertinent family history.  Social History   Social History  . Marital Status: Single    Spouse Name: N/A  . Number of Children: N/A  . Years of Education: N/A   Occupational History  . Not on file.   Social History Main Topics  . Smoking status: Current Every Day Smoker -- 0.50 packs/day for 60 years    Types: Cigarettes  . Smokeless tobacco: Never Used  . Alcohol Use: No  . Drug Use: No  . Sexual Activity: Not on file   Other Topics Concern  . Not on file   Social  History Narrative    Review of Systems: See HPI, otherwise negative ROS  Physical Exam: BP 154/77 mmHg  Pulse 83  Temp(Src) 98.6 F (37 C) (Tympanic)  Resp 17  Ht 5\' 4"  (1.626 m)  Wt 79.379 kg (175 lb)  BMI 30.02 kg/m2  SpO2 99% General:   Alert,  pleasant and cooperative in NAD Head:  Normocephalic and atraumatic. Neck:  Supple; no masses or thyromegaly. Lungs:  Clear throughout to auscultation.    Heart:  Regular rate and rhythm. Abdomen:  Soft, nontender and nondistended. Normal bowel sounds, without guarding, and without rebound.   Neurologic:  Alert and  oriented x4;  grossly normal neurologically.  Impression/Plan: Jasmine [name] is here for an endoscopy and colonoscopy to be performed for [facility] colon polyps and history of gastric cancer  Risks, benefits, limitations, and alternatives regarding  endoscopy and colonoscopy have been reviewed with the patient.  Questions have been answered.  All parties agreeable.   Gaylyn Cheers, MD  02/20/2015, 11:48 AM

## 2015-02-20 NOTE — Anesthesia Preprocedure Evaluation (Signed)
Anesthesia Evaluation  Patient identified by MRN, date of birth, ID band Patient awake    Reviewed: Allergy & Precautions, NPO status , Patient's Chart, lab work & pertinent test results  Airway Mallampati: II  TM Distance: <3 FB Neck ROM: Full    Dental  (+) Upper Dentures, Lower Dentures   Pulmonary COPD COPD inhaler, Current Smoker,  breath sounds clear to auscultation  Pulmonary exam normal       Cardiovascular negative cardio ROS Normal cardiovascular exam    Neuro/Psych negative neurological ROS  negative psych ROS   GI/Hepatic Hx of CA of stomach...pancreatitis hx   Endo/Other  negative endocrine ROS  Renal/GU Renal InsufficiencyRenal diseaseKidney stones  negative genitourinary   Musculoskeletal  (+) Arthritis -, Osteoarthritis,    Abdominal Normal abdominal exam  (+)   Peds negative pediatric ROS (+)  Hematology  (+) anemia ,   Anesthesia Other Findings   Reproductive/Obstetrics                             Anesthesia Physical Anesthesia Plan  ASA: III  Anesthesia Plan: General   Post-op Pain Management:    Induction: Intravenous  Airway Management Planned: Nasal Cannula  Additional Equipment:   Intra-op Plan:   Post-operative Plan:   Informed Consent: I have reviewed the patients History and Physical, chart, labs and discussed the procedure including the risks, benefits and alternatives for the proposed anesthesia with the patient or authorized representative who has indicated his/her understanding and acceptance.   Dental advisory given  Plan Discussed with: CRNA and Surgeon  Anesthesia Plan Comments:         Anesthesia Quick Evaluation

## 2015-02-21 LAB — SURGICAL PATHOLOGY

## 2015-02-22 ENCOUNTER — Encounter: Payer: Self-pay | Admitting: Unknown Physician Specialty

## 2016-11-08 ENCOUNTER — Emergency Department: Payer: Medicare Other

## 2016-11-08 ENCOUNTER — Encounter: Payer: Self-pay | Admitting: *Deleted

## 2016-11-08 ENCOUNTER — Emergency Department
Admission: EM | Admit: 2016-11-08 | Discharge: 2016-11-09 | Disposition: A | Discharge status: Home / Self Care | Payer: Medicare Other | Attending: Emergency Medicine | Admitting: Emergency Medicine

## 2016-11-08 DIAGNOSIS — R1110 Vomiting, unspecified: Secondary | ICD-10-CM | POA: Diagnosis not present

## 2016-11-08 DIAGNOSIS — R109 Unspecified abdominal pain: Secondary | ICD-10-CM | POA: Diagnosis not present

## 2016-11-08 DIAGNOSIS — J449 Chronic obstructive pulmonary disease, unspecified: Secondary | ICD-10-CM | POA: Diagnosis not present

## 2016-11-08 DIAGNOSIS — Z79899 Other long term (current) drug therapy: Secondary | ICD-10-CM | POA: Diagnosis not present

## 2016-11-08 DIAGNOSIS — F17210 Nicotine dependence, cigarettes, uncomplicated: Secondary | ICD-10-CM | POA: Diagnosis not present

## 2016-11-08 DIAGNOSIS — Z85028 Personal history of other malignant neoplasm of stomach: Secondary | ICD-10-CM | POA: Diagnosis not present

## 2016-11-08 DIAGNOSIS — R1084 Generalized abdominal pain: Secondary | ICD-10-CM

## 2016-11-08 DIAGNOSIS — R197 Diarrhea, unspecified: Secondary | ICD-10-CM | POA: Insufficient documentation

## 2016-11-08 LAB — URINALYSIS, COMPLETE (UACMP) WITH MICROSCOPIC
Bilirubin Urine: NEGATIVE
GLUCOSE, UA: NEGATIVE mg/dL
KETONES UR: 20 mg/dL — AB
Leukocytes, UA: NEGATIVE
NITRITE: NEGATIVE
PH: 5 (ref 5.0–8.0)
Protein, ur: 30 mg/dL — AB
SPECIFIC GRAVITY, URINE: 1.026 (ref 1.005–1.030)

## 2016-11-08 LAB — COMPREHENSIVE METABOLIC PANEL
ALT: 10 U/L — ABNORMAL LOW (ref 14–54)
AST: 16 U/L (ref 15–41)
Albumin: 3.8 g/dL (ref 3.5–5.0)
Alkaline Phosphatase: 86 U/L (ref 38–126)
Anion gap: 6 (ref 5–15)
BUN: 14 mg/dL (ref 6–20)
CALCIUM: 10.2 mg/dL (ref 8.9–10.3)
CO2: 22 mmol/L (ref 22–32)
Chloride: 107 mmol/L (ref 101–111)
Creatinine, Ser: 0.66 mg/dL (ref 0.44–1.00)
GFR calc Af Amer: >60 mL/min (ref >60)
Glucose, Bld: 90 mg/dL (ref 65–99)
Potassium: 3.8 mmol/L (ref 3.5–5.1)
Sodium: 135 mmol/L (ref 135–145)
Total Bilirubin: 1.1 mg/dL (ref 0.3–1.2)
Total Protein: 7.5 g/dL (ref 6.5–8.1)

## 2016-11-08 LAB — LIPASE, BLOOD: LIPASE: 32 U/L (ref 11–51)

## 2016-11-08 LAB — CBC
HCT: 42 % (ref 35.0–47.0)
HEMOGLOBIN: 14.3 g/dL (ref 12.0–16.0)
MCH: 32.6 pg (ref 26.0–34.0)
MCHC: 34 g/dL (ref 32.0–36.0)
MCV: 95.9 fL (ref 80.0–100.0)
PLATELETS: 185 10*3/uL (ref 150–440)
RBC: 4.37 MIL/uL (ref 3.80–5.20)
RDW: 13.9 % (ref 11.5–14.5)
WBC: 6.2 10*3/uL (ref 3.6–11.0)

## 2016-11-08 LAB — TROPONIN I: Troponin I: <0.03 ng/mL (ref <0.03)

## 2016-11-08 MED ORDER — DICYCLOMINE HCL 20 MG PO TABS: 20.0000 mg | ORAL_TABLET | Freq: Three times a day (TID) | ORAL | 0 refills | Status: DC | PRN

## 2016-11-08 MED ORDER — ONDANSETRON HCL 4 MG PO TABS: 4.0000 mg | ORAL_TABLET | Freq: Every day | ORAL | 0 refills | Status: DC | PRN

## 2016-11-08 MED ORDER — SODIUM CHLORIDE 0.9 % IV BOLUS (SEPSIS)
1000.0000 mL | Freq: Once | INTRAVENOUS | Status: AC
  Administered 2016-11-08: 1000 mL via INTRAVENOUS

## 2016-11-08 MED ORDER — MORPHINE SULFATE (PF) 4 MG/ML IV SOLN
4.0000 mg | Freq: Once | INTRAVENOUS | Status: AC
  Administered 2016-11-08: 4 mg via INTRAVENOUS
  Filled 2016-11-08: qty 1

## 2016-11-08 MED ORDER — ONDANSETRON HCL 4 MG/2ML IJ SOLN
4.0000 mg | Freq: Once | INTRAMUSCULAR | Status: AC
  Administered 2016-11-08: 4 mg via INTRAVENOUS
  Filled 2016-11-08: qty 2

## 2016-11-08 MED ORDER — BARIUM SULFATE 2.1 % PO SUSP
450.0000 mL | ORAL | Status: AC
  Administered 2016-11-08 (×2): 450 mL via ORAL

## 2016-11-08 NOTE — ED Triage Notes (Signed)
Pt complains of abdominal pain with burning, nausea , vomiting and shortness of breath starting yesterday, pt denies fever

## 2016-11-08 NOTE — ED Notes (Addendum)
Pt placed on 2L [place] because o2 sat were 92% on RA

## 2016-11-08 NOTE — ED Notes (Signed)
Patient transported to CT 

## 2016-11-08 NOTE — ED Provider Notes (Signed)
[name] Emergency Department Provider Note  ____________________________________________   First MD Initiated Contact with Patient 11/08/16 2026     (approximate)  I have reviewed the triage vital signs and the nursing notes.   HISTORY  Chief Complaint Abdominal Pain and Shortness of Breath   HPI Jasmine [name] is a 76 y.o. female With a history of COPD as well as stomach cancer who is presenting to the emergency Department with 9 out of 10 diffuse abdominal pain over the past day. She denies any nausea at this time but says that she has vomited once over the past day. Says also with a small amount of diarrhea yesterday. Does not report any radiation of her pain. Says that she is also with shortness of breath this is been ongoing for months and is unchanged and likely related to her COPD.does not report any chest pain.   Past Medical History:  Diagnosis Date  . Anemia   . Arthritis   . Cancer ([place]) 1989  . Cataract cortical, senile   . Chronic kidney disease   . COPD (chronic obstructive pulmonary disease) ([place])   . De Quervain's tenosynovitis, bilateral   . Nephrolithiasis   . Pancreatitis   . Spinal stenosis   . Stomach cancer ([place]) 1989    There are no active problems to display for this patient.   Past Surgical History:  Procedure Laterality Date  . ABDOMINAL HYSTERECTOMY    . billroth 1 hemigastrectomy    . BLADDER SURGERY    . COLONOSCOPY WITH PROPOFOL N/A 02/20/2015   Procedure: COLONOSCOPY WITH PROPOFOL;  Surgeon: Manya Silvas, MD;  Location: [facility] ENDOSCOPY;  Service: Endoscopy;  Laterality: N/A;  . ESOPHAGOGASTRODUODENOSCOPY  02/20/2015   Procedure: ESOPHAGOGASTRODUODENOSCOPY (EGD);  Surgeon: Manya Silvas, MD;  Location: [facility] ENDOSCOPY;  Service: Endoscopy;;  . kidney stone removed    . lt.knee surgery    . SHOULDER ACROMIOPLASTY    . WRIST SURGERY      Prior to Admission medications   Medication Sig Start Date  End Date Taking? Authorizing Provider  albuterol (PROVENTIL HFA;VENTOLIN HFA) 108 (90 BASE) MCG/ACT inhaler Inhale 2 puffs into the lungs every 4 (four) hours as needed for wheezing or shortness of breath.    [provider]  colchicine 0.6 MG tablet Take 1 tablet (0.6 mg total) by mouth 2 (two) times daily. 01/15/15   Ahmed Prima, MD  Umeclidinium-Vilanterol 62.5-25 MCG/INH AEPB Inhale 1 spray into the lungs every morning.    [provider]    Allergies Iohexol  No family history on file.  Social History Social History  Substance Use Topics  . Smoking status: Current Every Day Smoker    Packs/day: 0.50    Years: 60.00    Types: Cigarettes  . Smokeless tobacco: Never Used  . Alcohol use No    Review of Systems  Constitutional: No fever/chills Eyes: No visual changes. ENT: No sore throat. Cardiovascular: Denies chest pain. Respiratory: as above  Gastrointestinal:   No constipation. Genitourinary: Negative for dysuria. Musculoskeletal: Negative for back pain. Skin: Negative for rash. Neurological: Negative for headaches, focal weakness or numbness.   ____________________________________________   PHYSICAL EXAM:  VITAL SIGNS: ED Triage Vitals  Enc Vitals Group     BP 11/08/16 2042 (!) 143/97     Pulse Rate 11/08/16 2042 72     Resp 11/08/16 2042 18     Temp 11/08/16 2042 99 F (37.2 C)  Temp Source 11/08/16 2042 Oral     SpO2 11/08/16 2042 96 %     Weight 11/08/16 2014 175 lb (79.4 kg)     Height 11/08/16 2014 5\' 4"  (1.626 m)     Head Circumference --      Peak Flow --      Pain Score 11/08/16 2014 8     Pain Loc --      Pain Edu? --      Excl. in [place]? --     Constitutional: Alert and oriented. Well appearing and in no acute distress. Eyes: Conjunctivae are normal. PERRL. EOMI. Head: Atraumatic. Nose: No congestion/rhinnorhea. Mouth/Throat: Mucous membranes are moist.   Neck: No stridor.   Cardiovascular: Normal rate, regular  rhythm. Grossly normal heart sounds.  Respiratory: Normal respiratory effort.  No retractions. Lungs CTAB. Gastrointestinal: Soft with moderate and diffuse tenderness to palpation. No rebound or guarding. No distention.  Musculoskeletal: No lower extremity tenderness nor edema.  No joint effusions. Neurologic:  Normal speech and language. No gross focal neurologic deficits are appreciated. Skin:  Skin is warm, dry and intact. No rash noted. Psychiatric: Mood and affect are normal. Speech and behavior are normal.  ____________________________________________   LABS (all labs ordered are listed, but only abnormal results are displayed)  Labs Reviewed  COMPREHENSIVE METABOLIC PANEL - Abnormal; Notable for the following:       Result Value   ALT 10 (*)    All other components within normal limits  URINALYSIS, COMPLETE (UACMP) WITH MICROSCOPIC - Abnormal; Notable for the following:    Color, Urine YELLOW (*)    APPearance HAZY (*)    Hgb urine dipstick SMALL (*)    Ketones, ur 20 (*)    Protein, ur 30 (*)    Bacteria, UA RARE (*)    Squamous Epithelial / LPF 0-5 (*)    All other components within normal limits  LIPASE, BLOOD  CBC  TROPONIN I   ____________________________________________  EKG  ED ECG REPORT I, Doran Stabler, the attending physician, personally viewed and interpreted this ECG.   Date: 11/08/2016  EKG Time: 2028  Rate: 80  Rhythm: normal sinus rhythm  Axis: normal  Intervals:none  ST&T Change: No ST segment elevation or depression. T-wave inversions in V2 and V3 and V4.  No signifficant change from the EKG of 09/14/2012.  ____________________________________________  RADIOLOGY  pending ____________________________________________   PROCEDURES  Procedure(s) performed:   Procedures  Critical Care performed:   ____________________________________________   INITIAL IMPRESSION / ASSESSMENT AND PLAN / ED COURSE  Pertinent labs & imaging  results that were available during my care of the patient were reviewed by me and considered in my medical decision making (see chart for details).  ----------------------------------------- 11:15 PM on 11/08/2016 -----------------------------------------  Reassuring laboratories far. Patient pending CAT scan. Signed out to Dr. Dahlia Client.      ____________________________________________   FINAL CLINICAL IMPRESSION(S) / ED DIAGNOSES  Abdominal pain. Vomiting and diarrhea.    NEW MEDICATIONS STARTED DURING THIS VISIT:  New Prescriptions   No medications on file     Note:  This document was prepared using Dragon voice recognition software and may include unintentional dictation errors.    Orbie Pyo, MD 11/08/16 [phone]

## 2016-11-09 MED ORDER — ONDANSETRON 4 MG PO TBDP
ORAL_TABLET | ORAL | Status: AC
  Filled 2016-11-09: qty 1

## 2016-11-09 MED ORDER — ONDANSETRON 4 MG PO TBDP
4.0000 mg | ORAL_TABLET | Freq: Once | ORAL | Status: AC
  Administered 2016-11-09: 4 mg via ORAL

## 2016-11-09 NOTE — ED Provider Notes (Signed)
-----------------------------------------   12:24 AM on 11/09/2016 -----------------------------------------   Blood pressure (!) 160/80, pulse 66, temperature 99 F (37.2 C), temperature source Oral, resp. rate 14, height 5\' 4"  (1.626 m), weight 170 lb (77.1 kg), SpO2 100 %.  Assuming care from Dr. Clearnce Hasten.  In short, Jasmine [name] is a 76 y.o. female with a chief complaint of Abdominal Pain and Shortness of Breath .  Refer to the original H&P for additional details.  The current plan of care is to follow up the results of the CT scan.   Clinical Course as of Nov 10 22  Mon Nov 09, 2016  0021 1. Vague haziness about the distal body and tail of the pancreas, raising concern for mild pancreatitis. Would correlate with pancreatic lab values. No evidence of pseudocyst formation. 2. Mild cardiomegaly noted. 3. Scattered aortic atherosclerosis. 4. Mild degenerative change at the lower lumbar spine and sacroiliac joints.   CT Abdomen Pelvis Wo Contrast [AW]    Clinical Course User Index [AW] Loney Hering, MD   The patient's CT scan shows some haziness about the distal body and tail of the pancreas but the patient's lipase is unremarkable. The patient is feeling well at this time and she will be discharged home. I discussed this with the patient and her family and they understand the plan as stated. The patient will be discharged home. The patient has no further questions at this time.   Loney Hering, MD 11/09/16 [phone]

## 2017-04-06 ENCOUNTER — Other Ambulatory Visit: Payer: Self-pay | Admitting: Family Medicine

## 2017-04-06 DIAGNOSIS — Z1239 Encounter for other screening for malignant neoplasm of breast: Secondary | ICD-10-CM

## 2017-05-06 ENCOUNTER — Ambulatory Visit
Admission: RE | Admit: 2017-05-06 | Discharge: 2017-05-06 | Disposition: A | Discharge status: Home / Self Care | Payer: Medicare HMO | Source: Ambulatory Visit | Attending: Family Medicine | Admitting: Family Medicine

## 2017-05-06 DIAGNOSIS — Z1239 Encounter for other screening for malignant neoplasm of breast: Secondary | ICD-10-CM

## 2017-05-06 DIAGNOSIS — Z1231 Encounter for screening mammogram for malignant neoplasm of breast: Secondary | ICD-10-CM | POA: Diagnosis not present

## 2018-01-20 DIAGNOSIS — Z860100 Personal history of colon polyps, unspecified: Secondary | ICD-10-CM

## 2018-01-20 HISTORY — DX: Personal history of colon polyps, unspecified: Z86.0100

## 2018-01-20 HISTORY — DX: Personal history of colonic polyps: Z86.010

## 2018-03-21 ENCOUNTER — Encounter: Payer: Self-pay | Admitting: Anesthesiology

## 2018-03-21 ENCOUNTER — Ambulatory Visit
Admission: RE | Admit: 2018-03-21 | Discharge: 2018-03-21 | Disposition: A | Discharge status: Home / Self Care | Payer: Medicare HMO | Source: Ambulatory Visit | Attending: Unknown Physician Specialty | Admitting: Unknown Physician Specialty

## 2018-03-21 ENCOUNTER — Encounter
Admission: RE | Disposition: A | Discharge status: Home / Self Care | Payer: Self-pay | Source: Ambulatory Visit | Attending: Unknown Physician Specialty

## 2018-03-21 ENCOUNTER — Ambulatory Visit: Payer: Medicare HMO | Admitting: Anesthesiology

## 2018-03-21 DIAGNOSIS — K648 Other hemorrhoids: Secondary | ICD-10-CM | POA: Insufficient documentation

## 2018-03-21 DIAGNOSIS — Z888 Allergy status to other drugs, medicaments and biological substances status: Secondary | ICD-10-CM | POA: Insufficient documentation

## 2018-03-21 DIAGNOSIS — D125 Benign neoplasm of sigmoid colon: Secondary | ICD-10-CM | POA: Insufficient documentation

## 2018-03-21 DIAGNOSIS — Z79899 Other long term (current) drug therapy: Secondary | ICD-10-CM | POA: Diagnosis not present

## 2018-03-21 DIAGNOSIS — J449 Chronic obstructive pulmonary disease, unspecified: Secondary | ICD-10-CM | POA: Insufficient documentation

## 2018-03-21 DIAGNOSIS — Z85028 Personal history of other malignant neoplasm of stomach: Secondary | ICD-10-CM | POA: Insufficient documentation

## 2018-03-21 DIAGNOSIS — M1990 Unspecified osteoarthritis, unspecified site: Secondary | ICD-10-CM | POA: Insufficient documentation

## 2018-03-21 DIAGNOSIS — Z86010 Personal history of colonic polyps: Secondary | ICD-10-CM | POA: Diagnosis not present

## 2018-03-21 DIAGNOSIS — K31819 Angiodysplasia of stomach and duodenum without bleeding: Secondary | ICD-10-CM | POA: Insufficient documentation

## 2018-03-21 DIAGNOSIS — D122 Benign neoplasm of ascending colon: Secondary | ICD-10-CM | POA: Diagnosis not present

## 2018-03-21 DIAGNOSIS — D124 Benign neoplasm of descending colon: Secondary | ICD-10-CM | POA: Insufficient documentation

## 2018-03-21 DIAGNOSIS — K635 Polyp of colon: Secondary | ICD-10-CM | POA: Insufficient documentation

## 2018-03-21 DIAGNOSIS — F17210 Nicotine dependence, cigarettes, uncomplicated: Secondary | ICD-10-CM | POA: Insufficient documentation

## 2018-03-21 DIAGNOSIS — Z1211 Encounter for screening for malignant neoplasm of colon: Secondary | ICD-10-CM | POA: Diagnosis present

## 2018-03-21 HISTORY — DX: Personal history of colonic polyps: Z86.010

## 2018-03-21 HISTORY — PX: COLONOSCOPY WITH PROPOFOL: SHX5780

## 2018-03-21 HISTORY — PX: ESOPHAGOGASTRODUODENOSCOPY (EGD) WITH PROPOFOL: SHX5813

## 2018-03-21 SURGERY — COLONOSCOPY WITH PROPOFOL
Anesthesia: General

## 2018-03-21 MED ORDER — LIDOCAINE HCL (PF) 2 % IJ SOLN
INTRAMUSCULAR | Status: DC | PRN
  Administered 2018-03-21: 60 mg

## 2018-03-21 MED ORDER — LIDOCAINE HCL (PF) 2 % IJ SOLN
INTRAMUSCULAR | Status: AC
  Filled 2018-03-21: qty 10

## 2018-03-21 MED ORDER — FENTANYL CITRATE (PF) 100 MCG/2ML IJ SOLN
INTRAMUSCULAR | Status: AC
  Filled 2018-03-21: qty 2

## 2018-03-21 MED ORDER — GLYCOPYRROLATE 0.2 MG/ML IJ SOLN
INTRAMUSCULAR | Status: DC | PRN
  Administered 2018-03-21: 0.2 mg via INTRAVENOUS

## 2018-03-21 MED ORDER — GLYCOPYRROLATE 0.2 MG/ML IJ SOLN
INTRAMUSCULAR | Status: AC
  Filled 2018-03-21: qty 1

## 2018-03-21 MED ORDER — MIDAZOLAM HCL 2 MG/2ML IJ SOLN
INTRAMUSCULAR | Status: AC
  Filled 2018-03-21: qty 2

## 2018-03-21 MED ORDER — PROPOFOL 500 MG/50ML IV EMUL
INTRAVENOUS | Status: AC
  Filled 2018-03-21: qty 50

## 2018-03-21 MED ORDER — FENTANYL CITRATE (PF) 100 MCG/2ML IJ SOLN
INTRAMUSCULAR | Status: DC | PRN
  Administered 2018-03-21 (×3): 25 ug via INTRAVENOUS

## 2018-03-21 MED ORDER — PROPOFOL 500 MG/50ML IV EMUL
INTRAVENOUS | Status: DC | PRN
  Administered 2018-03-21: 25 ug/kg/min via INTRAVENOUS

## 2018-03-21 MED ORDER — LIDOCAINE HCL (PF) 1 % IJ SOLN
INTRAMUSCULAR | Status: AC
  Administered 2018-03-21: 0.3 mL
  Filled 2018-03-21: qty 2

## 2018-03-21 MED ORDER — SODIUM CHLORIDE 0.9 % IV SOLN
INTRAVENOUS | Status: DC
  Administered 2018-03-21: 1000 mL via INTRAVENOUS

## 2018-03-21 MED ORDER — MIDAZOLAM HCL 5 MG/5ML IJ SOLN
INTRAMUSCULAR | Status: DC | PRN
  Administered 2018-03-21: 1 mg via INTRAVENOUS

## 2018-03-21 MED ORDER — PROPOFOL 10 MG/ML IV BOLUS
INTRAVENOUS | Status: DC | PRN
  Administered 2018-03-21: 20 mg via INTRAVENOUS
  Administered 2018-03-21: 10 mg via INTRAVENOUS

## 2018-03-21 NOTE — H&P (Signed)
Primary Care Physician:  Juluis Pitch, MD Primary Gastroenterologist:  Dr. Vira Agar  Pre-Procedure History & Physical: HPI:  Jasmine [name] is a 77 y.o. female is here for an endoscopy and colonoscopy.  Done for [facility] colon polyp and previous gastric cancer.   Past Medical History:  Diagnosis Date  . Anemia   . Arthritis   . Cancer ([place]) 1989  . Cataract cortical, senile   . Chronic kidney disease   . COPD (chronic obstructive pulmonary disease) ([place])   . De Quervain's tenosynovitis, bilateral   . History of colon polyps 01/20/2018  . Nephrolithiasis   . Pancreatitis   . Spinal stenosis   . Stomach cancer ([place]) 1989    Past Surgical History:  Procedure Laterality Date  . ABDOMINAL HYSTERECTOMY    . billroth 1 hemigastrectomy    . BLADDER SURGERY    . COLONOSCOPY WITH PROPOFOL N/A 02/20/2015   Procedure: COLONOSCOPY WITH PROPOFOL;  Surgeon: Manya Silvas, MD;  Location: [facility] ENDOSCOPY;  Service: Endoscopy;  Laterality: N/A;  . ESOPHAGOGASTRODUODENOSCOPY  02/20/2015   Procedure: ESOPHAGOGASTRODUODENOSCOPY (EGD);  Surgeon: Manya Silvas, MD;  Location: [facility] ENDOSCOPY;  Service: Endoscopy;;  . kidney stone removed    . lt.knee surgery    . SHOULDER ACROMIOPLASTY    . WRIST SURGERY      Prior to Admission medications   Medication Sig Start Date End Date Taking? Authorizing Provider  metoprolol succinate (TOPROL-XL) 25 MG 24 hr tablet Take 25 mg by mouth daily.   Yes [provider]  albuterol (PROVENTIL HFA;VENTOLIN HFA) 108 (90 BASE) MCG/ACT inhaler Inhale 2 puffs into the lungs every 4 (four) hours as needed for wheezing or shortness of breath.    [provider]  colchicine 0.6 MG tablet Take 1 tablet (0.6 mg total) by mouth 2 (two) times daily. 01/15/15   Ahmed Prima, MD  dicyclomine (BENTYL) 20 MG tablet Take 1 tablet (20 mg total) by mouth 3 (three) times daily as needed for spasms. 11/08/16 11/08/17  Orbie Pyo, MD   ondansetron (ZOFRAN) 4 MG tablet Take 1 tablet (4 mg total) by mouth daily as needed. 11/08/16   Orbie Pyo, MD  Umeclidinium-Vilanterol 62.5-25 MCG/INH AEPB Inhale 1 spray into the lungs every morning.    [provider]    Allergies as of 01/21/2018 - Review Complete 11/08/2016  Allergen Reaction Noted  . Iohexol  02/07/2009    History reviewed. No pertinent family history.  Social History   Socioeconomic History  . Marital status: Single    Spouse name: Not on file  . Number of children: Not on file  . Years of education: Not on file  . Highest education level: Not on file  Occupational History  . Not on file  Social Needs  . Financial resource strain: Not on file  . Food insecurity:    Worry: Not on file    Inability: Not on file  . Transportation needs:    Medical: Not on file    Non-medical: Not on file  Tobacco Use  . Smoking status: Current Every Day Smoker    Packs/day: 0.50    Years: 60.00    Pack years: 30.00    Types: Cigarettes  . Smokeless tobacco: Never Used  Substance and Sexual Activity  . Alcohol use: No  . Drug use: No  . Sexual activity: Not on file  Lifestyle  . Physical activity:    Days per week: Not on file  Minutes per session: Not on file  . Stress: Not on file  Relationships  . Social connections:    Talks on phone: Not on file    Gets together: Not on file    Attends religious service: Not on file    Active member of club or organization: Not on file    Attends meetings of clubs or organizations: Not on file    Relationship status: Not on file  . Intimate partner violence:    Fear of current or ex partner: Not on file    Emotionally abused: Not on file    Physically abused: Not on file    Forced sexual activity: Not on file  Other Topics Concern  . Not on file  Social History Narrative  . Not on file    Review of Systems: See HPI, otherwise negative ROS  Physical Exam: BP (!) 161/91   Pulse 80    Temp (!) 96.8 F (36 C) (Tympanic)   Resp 17   Ht 5\' 4"  (1.626 m)   Wt 70.3 kg   SpO2 98%   BMI 26.61 kg/m  General:   Alert,  pleasant and cooperative in NAD Head:  Normocephalic and atraumatic. Neck:  Supple; no masses or thyromegaly. Lungs:  Clear throughout to auscultation.    Heart:  Regular rate and rhythm. Abdomen:  Soft, nontender and nondistended. Normal bowel sounds, without guarding, and without rebound.   Neurologic:  Alert and  oriented x4;  grossly normal neurologically.  Impression/Plan: Jasmine [name] is here for an endoscopy and colonoscopy to be performed for [facility] of colon polyps and gastric cancer  Risks, benefits, limitations, and alternatives regarding  endoscopy and colonoscopy have been reviewed with the patient.  Questions have been answered.  All parties agreeable.   Gaylyn Cheers, MD  03/21/2018, 12:40 PM

## 2018-03-21 NOTE — Transfer of Care (Signed)
Immediate Anesthesia Transfer of Care Note  Patient: Jasmine [name]  Procedure(s) Performed: COLONOSCOPY WITH PROPOFOL (N/A ) ESOPHAGOGASTRODUODENOSCOPY (EGD) WITH PROPOFOL (N/A )  Patient Location: PACU  Anesthesia Type:General  Level of Consciousness: sedated  Airway & Oxygen Therapy: Patient Spontanous Breathing and Patient connected to nasal cannula oxygen  Post-op Assessment: Report given to RN and Post -op Vital signs reviewed and stable  Post vital signs: Reviewed and stable  Last Vitals:  Vitals Value Taken Time  BP    Temp    Pulse    Resp    SpO2      Last Pain:  Vitals:   03/21/18 1207  TempSrc: Tympanic  PainSc: 0-No pain         Complications: No apparent anesthesia complications

## 2018-03-21 NOTE — Op Note (Signed)
[facility] Gastroenterology Patient Name: Jasmine [name] Procedure Date: 03/21/2018 12:42 PM MRN: [id number] Account #: [id number] Date of Birth: [birth year] Admit Type: Outpatient Age: 77 Room: [facility] ENDO ROOM 3 Gender: Female Note Status: Finalized Procedure:            Upper GI endoscopy Indications:          HISTORY OF GASTRIC CANCER Providers:            Manya Silvas, MD Referring MD:         Youlanda Roys. Lovie Macadamia, MD (Referring MD) Medicines:            Propofol per Anesthesia Complications:        No immediate complications. Procedure:            Pre-Anesthesia Assessment:                       - After reviewing the risks and benefits, the patient                        was deemed in satisfactory condition to undergo the                        procedure.                       After obtaining informed consent, the endoscope was                        passed under direct vision. Throughout the procedure,                        the patient's blood pressure, pulse, and oxygen                        saturations were monitored continuously. The Endoscope                        was introduced through the mouth, and advanced to the                        jejunum. The patient tolerated the procedure well. Findings:      The examined esophagus was normal. GEJ 40CM.      A deformity was found in the gastric antrum. Previous surgery.      Multiple diminutive angioectasias without bleeding were found in the       second portion of the duodenum. Impression:           - Normal esophagus.                       - Post-surgical deformity in the gastric antrum.                       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                       - No specimens collected. Recommendation:       - The findings and recommendations were discussed with                        the patient's family. Manya Silvas,  MD 03/21/2018 1:00:07 PM This report has been  signed electronically. Number of Addenda: 0 Note Initiated On: 03/21/2018 12:42 PM      [facility]

## 2018-03-21 NOTE — Op Note (Signed)
[facility] Gastroenterology Patient Name: Jasmine [name] Procedure Date: 03/21/2018 12:41 PM MRN: [id number] Account #: [id number] Date of Birth: [birth year] Admit Type: Outpatient Age: 77 Room: [facility] ENDO ROOM 3 Gender: Female Note Status: Finalized Procedure:            Colonoscopy Indications:          High risk colon cancer surveillance: Personal history                        of colonic polyps Providers:            Manya Silvas, MD Referring MD:         Youlanda Roys. Lovie Macadamia, MD (Referring MD) Medicines:            Propofol per Anesthesia Complications:        No immediate complications. Procedure:            Pre-Anesthesia Assessment:                       - After reviewing the risks and benefits, the patient                        was deemed in satisfactory condition to undergo the                        procedure.                       After obtaining informed consent, the colonoscope was                        passed under direct vision. Throughout the procedure,                        the patient's blood pressure, pulse, and oxygen                        saturations were monitored continuously. The                        Colonoscope was introduced through the anus and                        advanced to the the cecum, identified by appendiceal                        orifice and ileocecal valve. The colonoscopy was                        somewhat difficult due to deep abd breathing and                        abdominal motion. Successful completion of the                        procedure was aided by applying abdominal pressure. The                        patient tolerated the procedure fairly well. Findings:      Three sessile polyps were found in the ascending colon. The polyps were  diminutive in size. These polyps were removed with a jumbo cold forceps.       Resection and retrieval were complete.      A diminutive polyp was found in the  descending colon. The polyp was       sessile. The polyp was removed with a jumbo cold forceps. Resection and       retrieval were complete.      A small polyp was found in the sigmoid colon. The polyp was sessile. The       polyp was removed with a jumbo cold forceps. Resection and retrieval       were complete.      A small polyp was found in the sigmoid colon. The polyp was sessile. The       polyp was removed with a hot snare. Resection and retrieval were       complete.      Three sessile polyps were found in the ascending colon. The polyps were       diminutive in size. These polyps were removed with a hot snare.       Resection and retrieval were complete.      Internal hemorrhoids were found during endoscopy. The hemorrhoids were       small.      A medium polyp was found in the descending colon. The polyp was sessile.       lipoma. Impression:           - Three diminutive polyps in the ascending colon,                        removed with a jumbo cold forceps. Resected and                        retrieved.                       - One diminutive polyp in the descending colon, removed                        with a jumbo cold forceps. Resected and retrieved.                       - One small polyp in the sigmoid colon, removed with a                        jumbo cold forceps. Resected and retrieved.                       - One small polyp in the sigmoid colon, removed with a                        hot snare. Resected and retrieved.                       - Two diminutive polyps in the ascending colon, removed                        with a hot snare. Resected and retrieved.                       - Internal hemorrhoids. Recommendation:       - Await  pathology results. Manya Silvas, MD 03/21/2018 1:39:28 PM This report has been signed electronically. Number of Addenda: 0 Note Initiated On: 03/21/2018 12:41 PM Scope Withdrawal Time: 0 hours 16 minutes 16 seconds  Total Procedure  Duration: 0 hours 28 minutes 23 seconds       [facility]

## 2018-03-21 NOTE — Anesthesia Postprocedure Evaluation (Signed)
Anesthesia Post Note  Patient: [name]  Procedure(s) Performed: COLONOSCOPY WITH PROPOFOL (N/A ) ESOPHAGOGASTRODUODENOSCOPY (EGD) WITH PROPOFOL (N/A )  Patient location during evaluation: Endoscopy Anesthesia Type: General Level of consciousness: awake and alert Pain management: pain level controlled Vital Signs Assessment: post-procedure vital signs reviewed and stable Respiratory status: spontaneous breathing, nonlabored ventilation, respiratory function stable and patient connected to nasal cannula oxygen Cardiovascular status: blood pressure returned to baseline and stable Postop Assessment: no apparent nausea or vomiting Anesthetic complications: no     Last Vitals:  Vitals:   03/21/18 1357 03/21/18 1407  BP: (!) 163/74 (!) 167/70  Pulse: 71 69  Resp: 20 (!) 24  Temp:    SpO2: 100% 100%    Last Pain:  Vitals:   03/21/18 1357  TempSrc:   PainSc: 0-No pain                 Nazim Kadlec S

## 2018-03-21 NOTE — Anesthesia Preprocedure Evaluation (Signed)
Anesthesia Evaluation  Patient identified by MRN, date of birth, ID band Patient awake    Reviewed: Allergy & Precautions, NPO status , Patient's Chart, lab work & pertinent test results, reviewed documented beta blocker date and time   Airway Mallampati: II  TM Distance: >3 FB     Dental  (+) Chipped   Pulmonary COPD, Current Smoker,           Cardiovascular      Neuro/Psych    GI/Hepatic   Endo/Other    Renal/GU Renal disease     Musculoskeletal  (+) Arthritis ,   Abdominal   Peds  Hematology  (+) anemia ,   Anesthesia Other Findings Gout. Stomach ca, excised. On B-blockers.  Reproductive/Obstetrics                             Anesthesia Physical Anesthesia Plan  ASA: III  Anesthesia Plan: General   Post-op Pain Management:    Induction: Intravenous  PONV Risk Score and Plan:   Airway Management Planned:   Additional Equipment:   Intra-op Plan:   Post-operative Plan:   Informed Consent: I have reviewed the patients History and Physical, chart, labs and discussed the procedure including the risks, benefits and alternatives for the proposed anesthesia with the patient or authorized representative who has indicated his/her understanding and acceptance.     Plan Discussed with: CRNA  Anesthesia Plan Comments:         Anesthesia Quick Evaluation

## 2018-03-21 NOTE — Anesthesia Post-op Follow-up Note (Signed)
Anesthesia QCDR form completed.        

## 2018-03-22 ENCOUNTER — Encounter: Payer: Self-pay | Admitting: Unknown Physician Specialty

## 2018-03-24 LAB — SURGICAL PATHOLOGY

## 2018-05-03 ENCOUNTER — Other Ambulatory Visit: Payer: Self-pay | Admitting: Family Medicine

## 2018-05-03 DIAGNOSIS — Z1231 Encounter for screening mammogram for malignant neoplasm of breast: Secondary | ICD-10-CM

## 2018-05-24 ENCOUNTER — Ambulatory Visit
Admission: RE | Admit: 2018-05-24 | Discharge: 2018-05-24 | Disposition: A | Discharge status: Home / Self Care | Payer: Medicare HMO | Source: Ambulatory Visit | Attending: Family Medicine | Admitting: Family Medicine

## 2018-05-24 DIAGNOSIS — Z1231 Encounter for screening mammogram for malignant neoplasm of breast: Secondary | ICD-10-CM | POA: Insufficient documentation

## 2019-09-13 DIAGNOSIS — Z720 Tobacco use: Secondary | ICD-10-CM | POA: Diagnosis not present

## 2019-09-13 DIAGNOSIS — M069 Rheumatoid arthritis, unspecified: Secondary | ICD-10-CM | POA: Diagnosis not present

## 2019-09-13 DIAGNOSIS — I272 Pulmonary hypertension, unspecified: Secondary | ICD-10-CM | POA: Diagnosis not present

## 2019-09-13 DIAGNOSIS — I10 Essential (primary) hypertension: Secondary | ICD-10-CM | POA: Diagnosis not present

## 2019-09-21 ENCOUNTER — Ambulatory Visit: Payer: Self-pay | Attending: Internal Medicine

## 2019-09-21 DIAGNOSIS — Z23 Encounter for immunization: Secondary | ICD-10-CM

## 2019-09-21 NOTE — Progress Notes (Signed)
   Covid-19 Vaccination Clinic  Name:  Jasmine [name]    MRN: [id number] DOB: 02-26-1941  09/21/2019  Jasmine [name] was observed post Covid-19 immunization for 15 minutes without incident. She was provided with Vaccine Information Sheet and instruction to access the V-Safe system.   Jasmine [name] was instructed to call 911 with any severe reactions post vaccine: Marland Kitchen Difficulty breathing  . Swelling of face and throat  . A fast heartbeat  . A bad rash all over body  . Dizziness and weakness   Immunizations Administered    Name Date Dose VIS Date Route   Pfizer COVID-19 Vaccine 09/21/2019 11:18 AM 0.3 mL 06/16/2019 Intramuscular   Manufacturer: [place]   Lot: [id number]   [place]: [id number]

## 2019-10-17 ENCOUNTER — Ambulatory Visit: Payer: Medicare HMO | Attending: Internal Medicine

## 2019-10-17 DIAGNOSIS — Z23 Encounter for immunization: Secondary | ICD-10-CM

## 2019-10-17 NOTE — Progress Notes (Signed)
   Covid-19 Vaccination Clinic  Name:  Jasmine [name]    MRN: [id number] DOB: 1940-08-03  10/17/2019  Ms. Jasmine [name] was observed post Covid-19 immunization for 15 minutes without incident. She was provided with Vaccine Information Sheet and instruction to access the V-Safe system.   Ms. Jasmine [name] was instructed to call 911 with any severe reactions post vaccine: Marland Kitchen Difficulty breathing  . Swelling of face and throat  . A fast heartbeat  . A bad rash all over body  . Dizziness and weakness   Immunizations Administered    Name Date Dose VIS Date Route   Pfizer COVID-19 Vaccine 10/17/2019 10:34 AM 0.3 mL 06/16/2019 Intramuscular   Manufacturer: [place]   Lot: [id number]   [place]: [id number]

## 2019-10-18 ENCOUNTER — Other Ambulatory Visit: Payer: Self-pay | Admitting: Family Medicine

## 2019-10-18 DIAGNOSIS — Z1231 Encounter for screening mammogram for malignant neoplasm of breast: Secondary | ICD-10-CM

## 2019-12-05 ENCOUNTER — Ambulatory Visit
Admission: RE | Admit: 2019-12-05 | Discharge: 2019-12-05 | Disposition: A | Discharge status: Home / Self Care | Payer: Medicare HMO | Source: Ambulatory Visit | Attending: Family Medicine | Admitting: Family Medicine

## 2019-12-05 DIAGNOSIS — Z1231 Encounter for screening mammogram for malignant neoplasm of breast: Secondary | ICD-10-CM

## 2020-03-14 DIAGNOSIS — I272 Pulmonary hypertension, unspecified: Secondary | ICD-10-CM | POA: Diagnosis not present

## 2020-03-14 DIAGNOSIS — Z720 Tobacco use: Secondary | ICD-10-CM | POA: Diagnosis not present

## 2020-03-14 DIAGNOSIS — I10 Essential (primary) hypertension: Secondary | ICD-10-CM | POA: Diagnosis not present

## 2020-03-14 DIAGNOSIS — J410 Simple chronic bronchitis: Secondary | ICD-10-CM | POA: Diagnosis not present

## 2020-03-18 DIAGNOSIS — J449 Chronic obstructive pulmonary disease, unspecified: Secondary | ICD-10-CM | POA: Diagnosis not present

## 2020-03-18 DIAGNOSIS — J309 Allergic rhinitis, unspecified: Secondary | ICD-10-CM | POA: Diagnosis not present

## 2020-03-18 DIAGNOSIS — I10 Essential (primary) hypertension: Secondary | ICD-10-CM | POA: Diagnosis not present

## 2020-03-18 DIAGNOSIS — D649 Anemia, unspecified: Secondary | ICD-10-CM | POA: Diagnosis not present

## 2020-03-18 DIAGNOSIS — Z720 Tobacco use: Secondary | ICD-10-CM | POA: Diagnosis not present

## 2020-06-01 IMAGING — MG DIGITAL SCREENING BILAT W/ TOMO W/ CAD
6 of 10 series · 6 of 30 positions shown · non-contrast
Comparison: Previous exam(s).

CLINICAL DATA: Screening.

EXAM:
DIGITAL SCREENING BILATERAL MAMMOGRAM WITH TOMO AND CAD

[R MLO synth-2D]
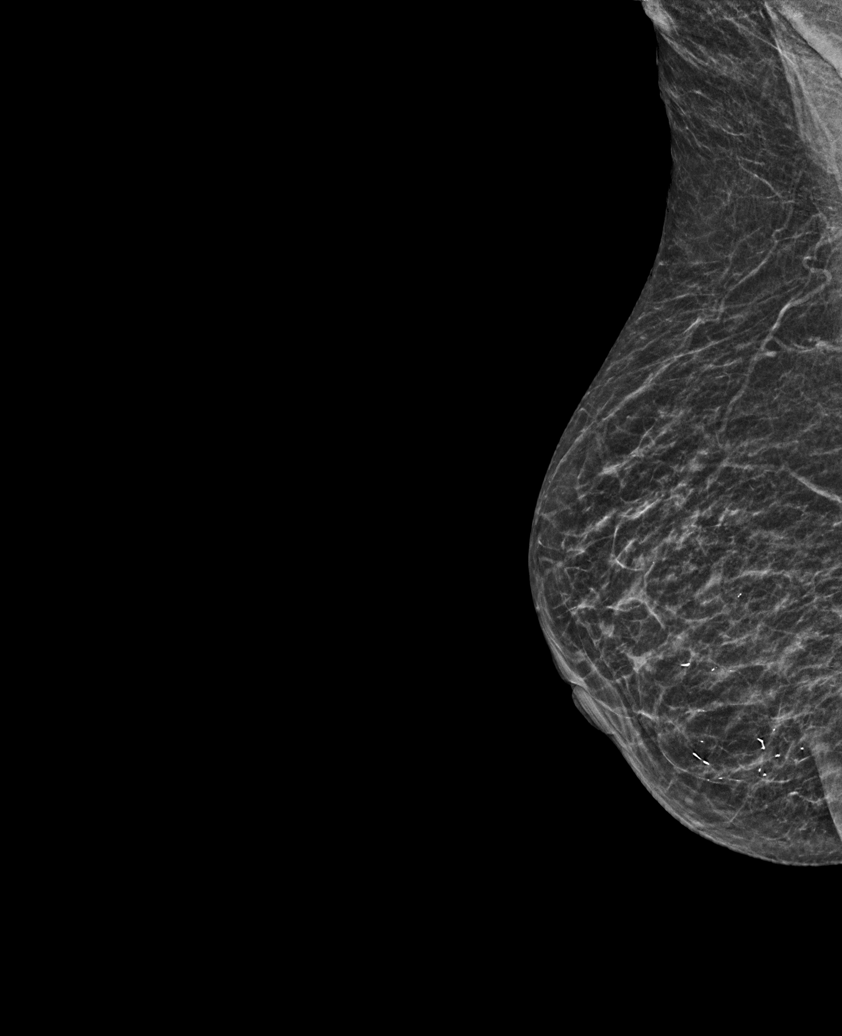

[R CC synth-2D (1 of 2)]
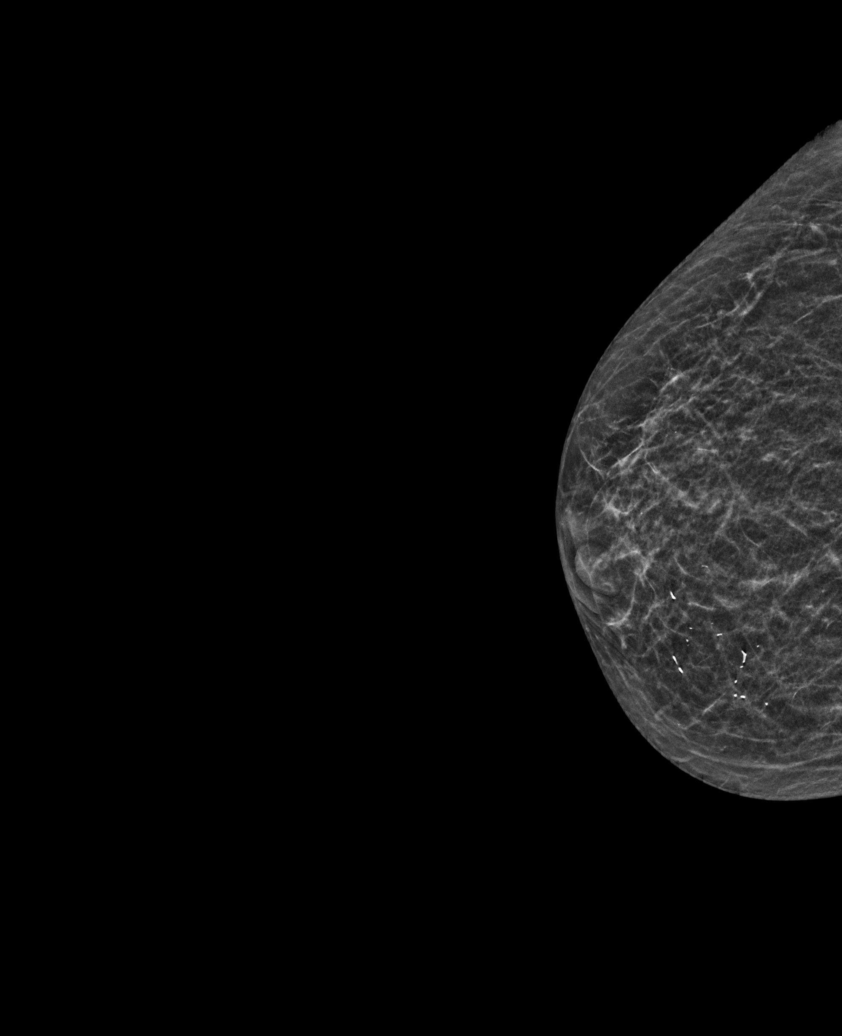

[L MLO synth-2D]
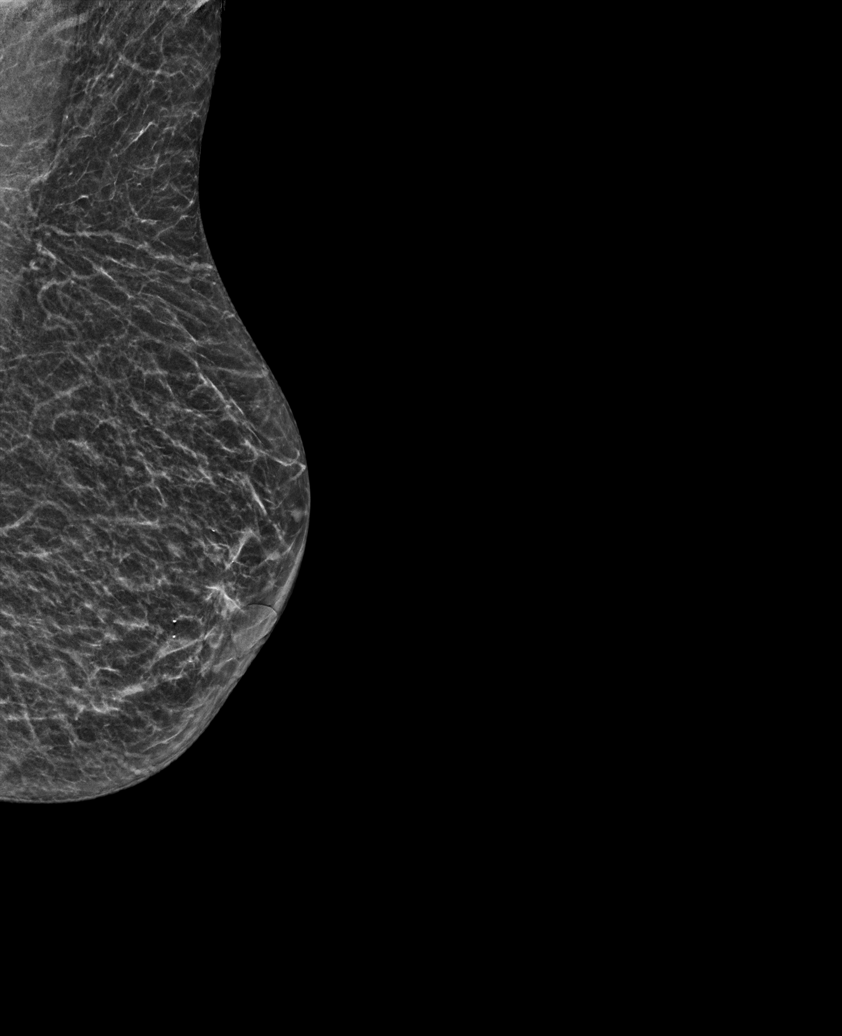

[R CC synth-2D (2 of 2)]
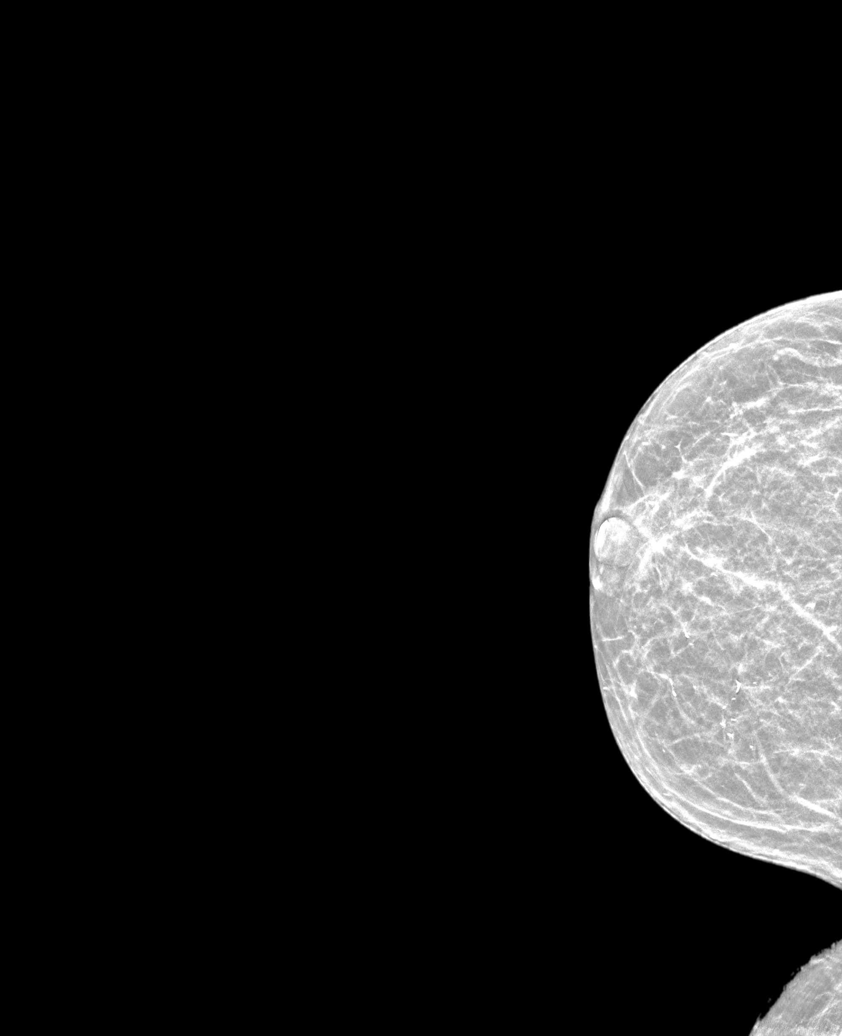

[L CC synth-2D]
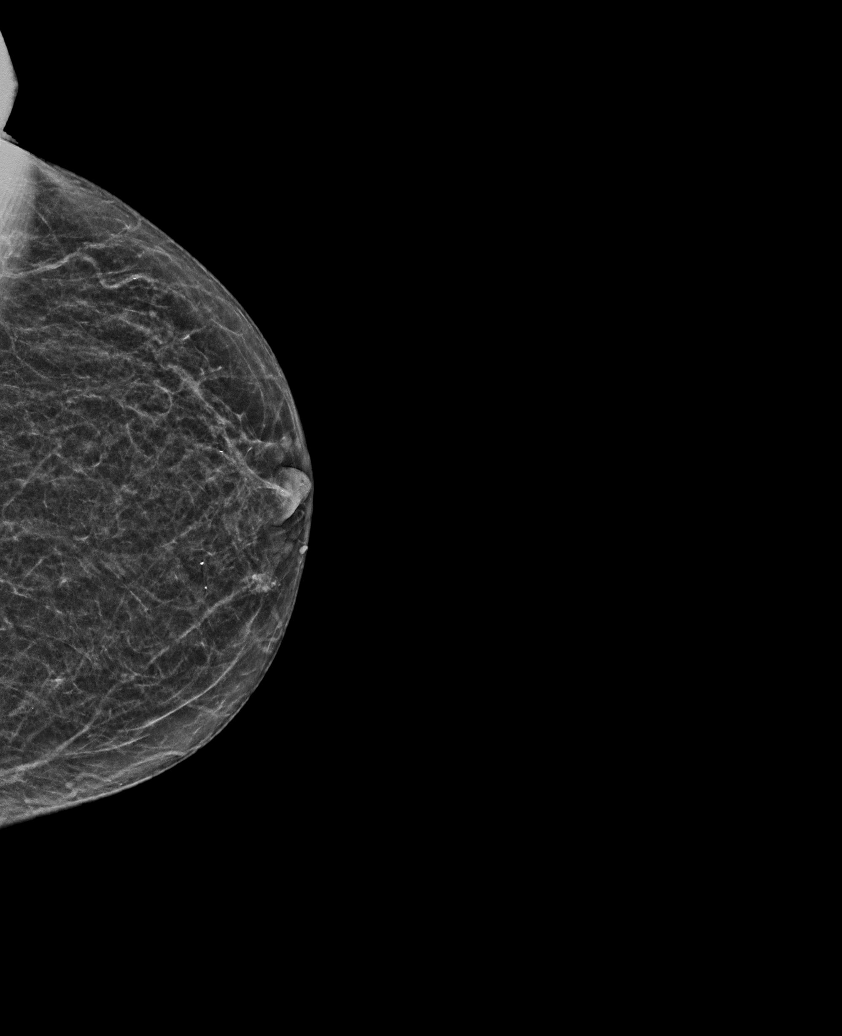

[R CC tomo · tomo slice 21/41.0]
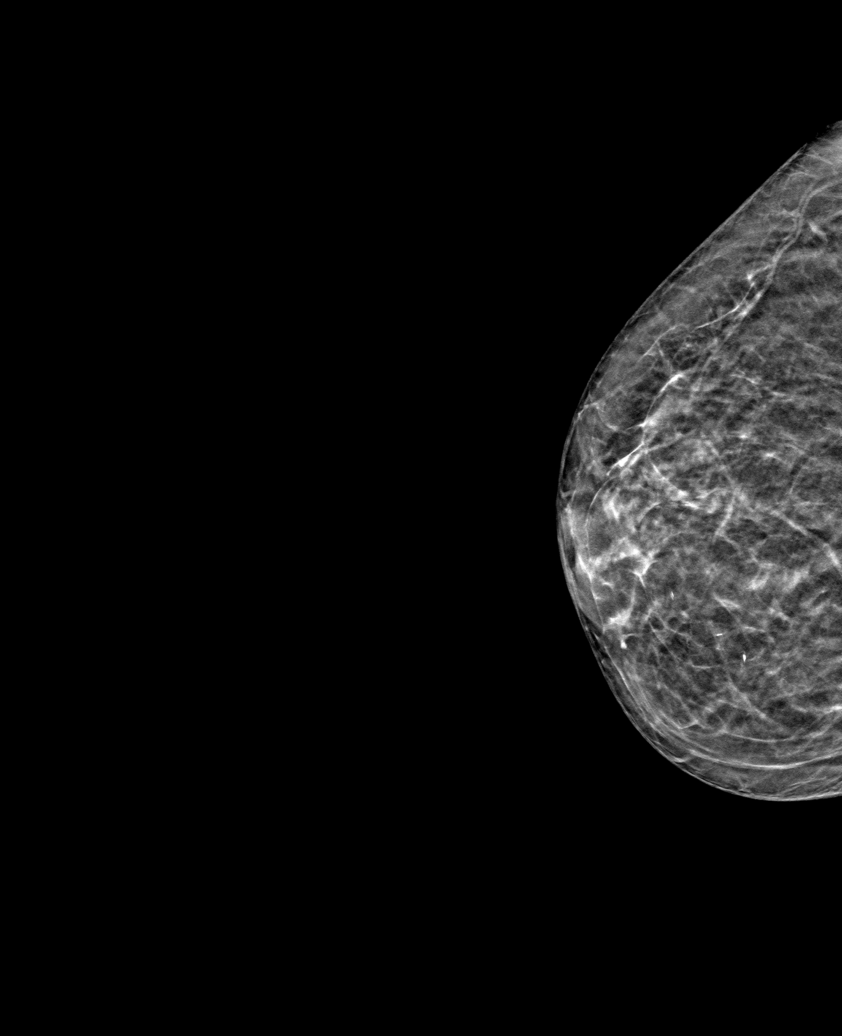

[6 of 30 positions shown; findings below may reference images not displayed]

ACR Breast Density Category b: There are scattered areas of
fibroglandular density.
FINDINGS: There are no findings suspicious for malignancy. Images were
processed with CAD.
IMPRESSION: No mammographic evidence of malignancy. A result letter of this
screening mammogram will be mailed directly to the patient.

RECOMMENDATION:
Screening mammogram in one year. (Code:[id number]-775)

BI-RADS CATEGORY  1: Negative.

## 2020-09-19 DIAGNOSIS — I10 Essential (primary) hypertension: Secondary | ICD-10-CM | POA: Diagnosis not present

## 2020-09-19 DIAGNOSIS — I272 Pulmonary hypertension, unspecified: Secondary | ICD-10-CM | POA: Diagnosis not present

## 2020-09-19 DIAGNOSIS — M069 Rheumatoid arthritis, unspecified: Secondary | ICD-10-CM | POA: Diagnosis not present

## 2020-09-19 DIAGNOSIS — Z720 Tobacco use: Secondary | ICD-10-CM | POA: Diagnosis not present

## 2020-09-19 DIAGNOSIS — J449 Chronic obstructive pulmonary disease, unspecified: Secondary | ICD-10-CM | POA: Diagnosis not present

## 2020-11-04 ENCOUNTER — Other Ambulatory Visit: Payer: Self-pay | Admitting: Family Medicine

## 2020-11-04 DIAGNOSIS — Z1231 Encounter for screening mammogram for malignant neoplasm of breast: Secondary | ICD-10-CM

## 2020-12-05 ENCOUNTER — Other Ambulatory Visit: Payer: Self-pay

## 2020-12-05 ENCOUNTER — Ambulatory Visit
Admission: RE | Admit: 2020-12-05 | Discharge: 2020-12-05 | Disposition: A | Discharge status: Home / Self Care | Payer: Medicare HMO | Source: Ambulatory Visit | Attending: Family Medicine | Admitting: Family Medicine

## 2020-12-05 DIAGNOSIS — M1990 Unspecified osteoarthritis, unspecified site: Secondary | ICD-10-CM | POA: Diagnosis not present

## 2020-12-05 DIAGNOSIS — M160 Bilateral primary osteoarthritis of hip: Secondary | ICD-10-CM | POA: Diagnosis not present

## 2020-12-05 DIAGNOSIS — Z1231 Encounter for screening mammogram for malignant neoplasm of breast: Secondary | ICD-10-CM | POA: Insufficient documentation

## 2020-12-05 DIAGNOSIS — Z0000 Encounter for general adult medical examination without abnormal findings: Secondary | ICD-10-CM | POA: Diagnosis not present

## 2020-12-05 DIAGNOSIS — M4800 Spinal stenosis, site unspecified: Secondary | ICD-10-CM | POA: Diagnosis not present

## 2020-12-05 DIAGNOSIS — S32040A Wedge compression fracture of fourth lumbar vertebra, initial encounter for closed fracture: Secondary | ICD-10-CM | POA: Diagnosis not present

## 2020-12-05 DIAGNOSIS — J449 Chronic obstructive pulmonary disease, unspecified: Secondary | ICD-10-CM | POA: Diagnosis not present

## 2020-12-05 DIAGNOSIS — J309 Allergic rhinitis, unspecified: Secondary | ICD-10-CM | POA: Diagnosis not present

## 2020-12-05 DIAGNOSIS — Z1331 Encounter for screening for depression: Secondary | ICD-10-CM | POA: Diagnosis not present

## 2020-12-05 DIAGNOSIS — I10 Essential (primary) hypertension: Secondary | ICD-10-CM | POA: Diagnosis not present

## 2020-12-05 DIAGNOSIS — Z720 Tobacco use: Secondary | ICD-10-CM | POA: Diagnosis not present

## 2020-12-05 DIAGNOSIS — R634 Abnormal weight loss: Secondary | ICD-10-CM | POA: Diagnosis not present

## 2020-12-05 DIAGNOSIS — I878 Other specified disorders of veins: Secondary | ICD-10-CM | POA: Diagnosis not present

## 2020-12-05 DIAGNOSIS — M4316 Spondylolisthesis, lumbar region: Secondary | ICD-10-CM | POA: Diagnosis not present

## 2020-12-11 DIAGNOSIS — N200 Calculus of kidney: Secondary | ICD-10-CM | POA: Diagnosis not present

## 2020-12-11 DIAGNOSIS — D649 Anemia, unspecified: Secondary | ICD-10-CM | POA: Diagnosis not present

## 2020-12-11 DIAGNOSIS — M48061 Spinal stenosis, lumbar region without neurogenic claudication: Secondary | ICD-10-CM | POA: Diagnosis not present

## 2020-12-11 DIAGNOSIS — J449 Chronic obstructive pulmonary disease, unspecified: Secondary | ICD-10-CM | POA: Diagnosis not present

## 2020-12-11 DIAGNOSIS — M1990 Unspecified osteoarthritis, unspecified site: Secondary | ICD-10-CM | POA: Diagnosis not present

## 2020-12-11 DIAGNOSIS — J309 Allergic rhinitis, unspecified: Secondary | ICD-10-CM | POA: Diagnosis not present

## 2020-12-11 DIAGNOSIS — I10 Essential (primary) hypertension: Secondary | ICD-10-CM | POA: Diagnosis not present

## 2020-12-11 DIAGNOSIS — F17210 Nicotine dependence, cigarettes, uncomplicated: Secondary | ICD-10-CM | POA: Diagnosis not present

## 2020-12-11 DIAGNOSIS — I272 Pulmonary hypertension, unspecified: Secondary | ICD-10-CM | POA: Diagnosis not present

## 2020-12-16 DIAGNOSIS — M1990 Unspecified osteoarthritis, unspecified site: Secondary | ICD-10-CM | POA: Diagnosis not present

## 2020-12-16 DIAGNOSIS — M48061 Spinal stenosis, lumbar region without neurogenic claudication: Secondary | ICD-10-CM | POA: Diagnosis not present

## 2020-12-16 DIAGNOSIS — N200 Calculus of kidney: Secondary | ICD-10-CM | POA: Diagnosis not present

## 2020-12-16 DIAGNOSIS — D649 Anemia, unspecified: Secondary | ICD-10-CM | POA: Diagnosis not present

## 2020-12-16 DIAGNOSIS — F17210 Nicotine dependence, cigarettes, uncomplicated: Secondary | ICD-10-CM | POA: Diagnosis not present

## 2020-12-16 DIAGNOSIS — J449 Chronic obstructive pulmonary disease, unspecified: Secondary | ICD-10-CM | POA: Diagnosis not present

## 2020-12-16 DIAGNOSIS — J309 Allergic rhinitis, unspecified: Secondary | ICD-10-CM | POA: Diagnosis not present

## 2020-12-16 DIAGNOSIS — I10 Essential (primary) hypertension: Secondary | ICD-10-CM | POA: Diagnosis not present

## 2020-12-16 DIAGNOSIS — I272 Pulmonary hypertension, unspecified: Secondary | ICD-10-CM | POA: Diagnosis not present

## 2020-12-17 DIAGNOSIS — M48061 Spinal stenosis, lumbar region without neurogenic claudication: Secondary | ICD-10-CM | POA: Diagnosis not present

## 2020-12-17 DIAGNOSIS — D649 Anemia, unspecified: Secondary | ICD-10-CM | POA: Diagnosis not present

## 2020-12-17 DIAGNOSIS — J449 Chronic obstructive pulmonary disease, unspecified: Secondary | ICD-10-CM | POA: Diagnosis not present

## 2020-12-17 DIAGNOSIS — I272 Pulmonary hypertension, unspecified: Secondary | ICD-10-CM | POA: Diagnosis not present

## 2020-12-17 DIAGNOSIS — M1990 Unspecified osteoarthritis, unspecified site: Secondary | ICD-10-CM | POA: Diagnosis not present

## 2020-12-17 DIAGNOSIS — I10 Essential (primary) hypertension: Secondary | ICD-10-CM | POA: Diagnosis not present

## 2020-12-17 DIAGNOSIS — N200 Calculus of kidney: Secondary | ICD-10-CM | POA: Diagnosis not present

## 2020-12-24 DIAGNOSIS — I10 Essential (primary) hypertension: Secondary | ICD-10-CM | POA: Diagnosis not present

## 2020-12-24 DIAGNOSIS — D649 Anemia, unspecified: Secondary | ICD-10-CM | POA: Diagnosis not present

## 2020-12-24 DIAGNOSIS — J309 Allergic rhinitis, unspecified: Secondary | ICD-10-CM | POA: Diagnosis not present

## 2020-12-24 DIAGNOSIS — J449 Chronic obstructive pulmonary disease, unspecified: Secondary | ICD-10-CM | POA: Diagnosis not present

## 2020-12-24 DIAGNOSIS — I272 Pulmonary hypertension, unspecified: Secondary | ICD-10-CM | POA: Diagnosis not present

## 2020-12-24 DIAGNOSIS — F17210 Nicotine dependence, cigarettes, uncomplicated: Secondary | ICD-10-CM | POA: Diagnosis not present

## 2020-12-24 DIAGNOSIS — M48061 Spinal stenosis, lumbar region without neurogenic claudication: Secondary | ICD-10-CM | POA: Diagnosis not present

## 2020-12-24 DIAGNOSIS — M1990 Unspecified osteoarthritis, unspecified site: Secondary | ICD-10-CM | POA: Diagnosis not present

## 2020-12-24 DIAGNOSIS — N200 Calculus of kidney: Secondary | ICD-10-CM | POA: Diagnosis not present

## 2020-12-25 DIAGNOSIS — S32040A Wedge compression fracture of fourth lumbar vertebra, initial encounter for closed fracture: Secondary | ICD-10-CM | POA: Diagnosis not present

## 2020-12-26 DIAGNOSIS — I272 Pulmonary hypertension, unspecified: Secondary | ICD-10-CM | POA: Diagnosis not present

## 2020-12-26 DIAGNOSIS — M1990 Unspecified osteoarthritis, unspecified site: Secondary | ICD-10-CM | POA: Diagnosis not present

## 2020-12-26 DIAGNOSIS — M48061 Spinal stenosis, lumbar region without neurogenic claudication: Secondary | ICD-10-CM | POA: Diagnosis not present

## 2020-12-26 DIAGNOSIS — J449 Chronic obstructive pulmonary disease, unspecified: Secondary | ICD-10-CM | POA: Diagnosis not present

## 2020-12-26 DIAGNOSIS — D649 Anemia, unspecified: Secondary | ICD-10-CM | POA: Diagnosis not present

## 2020-12-26 DIAGNOSIS — J309 Allergic rhinitis, unspecified: Secondary | ICD-10-CM | POA: Diagnosis not present

## 2020-12-26 DIAGNOSIS — N200 Calculus of kidney: Secondary | ICD-10-CM | POA: Diagnosis not present

## 2020-12-26 DIAGNOSIS — I10 Essential (primary) hypertension: Secondary | ICD-10-CM | POA: Diagnosis not present

## 2020-12-26 DIAGNOSIS — F17210 Nicotine dependence, cigarettes, uncomplicated: Secondary | ICD-10-CM | POA: Diagnosis not present

## 2020-12-30 ENCOUNTER — Other Ambulatory Visit: Payer: Self-pay | Admitting: Student

## 2020-12-30 DIAGNOSIS — N200 Calculus of kidney: Secondary | ICD-10-CM | POA: Diagnosis not present

## 2020-12-30 DIAGNOSIS — J309 Allergic rhinitis, unspecified: Secondary | ICD-10-CM | POA: Diagnosis not present

## 2020-12-30 DIAGNOSIS — S32040A Wedge compression fracture of fourth lumbar vertebra, initial encounter for closed fracture: Secondary | ICD-10-CM

## 2020-12-30 DIAGNOSIS — I10 Essential (primary) hypertension: Secondary | ICD-10-CM | POA: Diagnosis not present

## 2020-12-30 DIAGNOSIS — M1990 Unspecified osteoarthritis, unspecified site: Secondary | ICD-10-CM | POA: Diagnosis not present

## 2020-12-30 DIAGNOSIS — M48061 Spinal stenosis, lumbar region without neurogenic claudication: Secondary | ICD-10-CM | POA: Diagnosis not present

## 2020-12-30 DIAGNOSIS — F17210 Nicotine dependence, cigarettes, uncomplicated: Secondary | ICD-10-CM | POA: Diagnosis not present

## 2020-12-30 DIAGNOSIS — I272 Pulmonary hypertension, unspecified: Secondary | ICD-10-CM | POA: Diagnosis not present

## 2020-12-30 DIAGNOSIS — J449 Chronic obstructive pulmonary disease, unspecified: Secondary | ICD-10-CM | POA: Diagnosis not present

## 2020-12-30 DIAGNOSIS — D649 Anemia, unspecified: Secondary | ICD-10-CM | POA: Diagnosis not present

## 2021-01-01 ENCOUNTER — Ambulatory Visit
Admission: RE | Admit: 2021-01-01 | Discharge: 2021-01-01 | Disposition: A | Discharge status: Home / Self Care | Payer: Medicare HMO | Source: Ambulatory Visit | Attending: Student | Admitting: Student

## 2021-01-01 ENCOUNTER — Other Ambulatory Visit: Payer: Self-pay

## 2021-01-01 DIAGNOSIS — S32040A Wedge compression fracture of fourth lumbar vertebra, initial encounter for closed fracture: Secondary | ICD-10-CM

## 2021-01-01 DIAGNOSIS — M545 Low back pain, unspecified: Secondary | ICD-10-CM | POA: Diagnosis not present

## 2021-01-06 DIAGNOSIS — I272 Pulmonary hypertension, unspecified: Secondary | ICD-10-CM | POA: Diagnosis not present

## 2021-01-06 DIAGNOSIS — J309 Allergic rhinitis, unspecified: Secondary | ICD-10-CM | POA: Diagnosis not present

## 2021-01-06 DIAGNOSIS — D649 Anemia, unspecified: Secondary | ICD-10-CM | POA: Diagnosis not present

## 2021-01-06 DIAGNOSIS — N200 Calculus of kidney: Secondary | ICD-10-CM | POA: Diagnosis not present

## 2021-01-06 DIAGNOSIS — J449 Chronic obstructive pulmonary disease, unspecified: Secondary | ICD-10-CM | POA: Diagnosis not present

## 2021-01-06 DIAGNOSIS — I10 Essential (primary) hypertension: Secondary | ICD-10-CM | POA: Diagnosis not present

## 2021-01-06 DIAGNOSIS — F17210 Nicotine dependence, cigarettes, uncomplicated: Secondary | ICD-10-CM | POA: Diagnosis not present

## 2021-01-06 DIAGNOSIS — M1990 Unspecified osteoarthritis, unspecified site: Secondary | ICD-10-CM | POA: Diagnosis not present

## 2021-01-06 DIAGNOSIS — M48061 Spinal stenosis, lumbar region without neurogenic claudication: Secondary | ICD-10-CM | POA: Diagnosis not present

## 2021-01-13 DIAGNOSIS — S32040G Wedge compression fracture of fourth lumbar vertebra, subsequent encounter for fracture with delayed healing: Secondary | ICD-10-CM | POA: Diagnosis not present

## 2021-01-13 DIAGNOSIS — M48062 Spinal stenosis, lumbar region with neurogenic claudication: Secondary | ICD-10-CM | POA: Diagnosis not present

## 2021-01-14 DIAGNOSIS — D649 Anemia, unspecified: Secondary | ICD-10-CM | POA: Diagnosis not present

## 2021-01-14 DIAGNOSIS — J309 Allergic rhinitis, unspecified: Secondary | ICD-10-CM | POA: Diagnosis not present

## 2021-01-14 DIAGNOSIS — N200 Calculus of kidney: Secondary | ICD-10-CM | POA: Diagnosis not present

## 2021-01-14 DIAGNOSIS — M48061 Spinal stenosis, lumbar region without neurogenic claudication: Secondary | ICD-10-CM | POA: Diagnosis not present

## 2021-01-14 DIAGNOSIS — M1990 Unspecified osteoarthritis, unspecified site: Secondary | ICD-10-CM | POA: Diagnosis not present

## 2021-01-14 DIAGNOSIS — I10 Essential (primary) hypertension: Secondary | ICD-10-CM | POA: Diagnosis not present

## 2021-01-14 DIAGNOSIS — I272 Pulmonary hypertension, unspecified: Secondary | ICD-10-CM | POA: Diagnosis not present

## 2021-01-14 DIAGNOSIS — F17210 Nicotine dependence, cigarettes, uncomplicated: Secondary | ICD-10-CM | POA: Diagnosis not present

## 2021-01-14 DIAGNOSIS — J449 Chronic obstructive pulmonary disease, unspecified: Secondary | ICD-10-CM | POA: Diagnosis not present

## 2021-01-21 DIAGNOSIS — J309 Allergic rhinitis, unspecified: Secondary | ICD-10-CM | POA: Diagnosis not present

## 2021-01-21 DIAGNOSIS — F17210 Nicotine dependence, cigarettes, uncomplicated: Secondary | ICD-10-CM | POA: Diagnosis not present

## 2021-01-21 DIAGNOSIS — D649 Anemia, unspecified: Secondary | ICD-10-CM | POA: Diagnosis not present

## 2021-01-21 DIAGNOSIS — G8929 Other chronic pain: Secondary | ICD-10-CM | POA: Diagnosis not present

## 2021-01-21 DIAGNOSIS — I272 Pulmonary hypertension, unspecified: Secondary | ICD-10-CM | POA: Diagnosis not present

## 2021-01-21 DIAGNOSIS — M1990 Unspecified osteoarthritis, unspecified site: Secondary | ICD-10-CM | POA: Diagnosis not present

## 2021-01-21 DIAGNOSIS — J449 Chronic obstructive pulmonary disease, unspecified: Secondary | ICD-10-CM | POA: Diagnosis not present

## 2021-01-21 DIAGNOSIS — N200 Calculus of kidney: Secondary | ICD-10-CM | POA: Diagnosis not present

## 2021-01-21 DIAGNOSIS — M48061 Spinal stenosis, lumbar region without neurogenic claudication: Secondary | ICD-10-CM | POA: Diagnosis not present

## 2021-01-21 DIAGNOSIS — M48062 Spinal stenosis, lumbar region with neurogenic claudication: Secondary | ICD-10-CM | POA: Diagnosis not present

## 2021-01-21 DIAGNOSIS — I10 Essential (primary) hypertension: Secondary | ICD-10-CM | POA: Diagnosis not present

## 2021-01-21 DIAGNOSIS — M5442 Lumbago with sciatica, left side: Secondary | ICD-10-CM | POA: Diagnosis not present

## 2021-01-21 DIAGNOSIS — M5441 Lumbago with sciatica, right side: Secondary | ICD-10-CM | POA: Diagnosis not present

## 2021-01-28 DIAGNOSIS — M5416 Radiculopathy, lumbar region: Secondary | ICD-10-CM | POA: Diagnosis not present

## 2021-01-28 DIAGNOSIS — M48062 Spinal stenosis, lumbar region with neurogenic claudication: Secondary | ICD-10-CM | POA: Diagnosis not present

## 2021-02-07 DIAGNOSIS — H2513 Age-related nuclear cataract, bilateral: Secondary | ICD-10-CM | POA: Diagnosis not present

## 2021-02-07 DIAGNOSIS — Z0100 Encounter for examination of eyes and vision without abnormal findings: Secondary | ICD-10-CM | POA: Diagnosis not present

## 2021-02-20 DIAGNOSIS — M48062 Spinal stenosis, lumbar region with neurogenic claudication: Secondary | ICD-10-CM | POA: Diagnosis not present

## 2021-03-11 DIAGNOSIS — G8929 Other chronic pain: Secondary | ICD-10-CM | POA: Diagnosis not present

## 2021-03-11 DIAGNOSIS — M48062 Spinal stenosis, lumbar region with neurogenic claudication: Secondary | ICD-10-CM | POA: Diagnosis not present

## 2021-03-11 DIAGNOSIS — M5441 Lumbago with sciatica, right side: Secondary | ICD-10-CM | POA: Diagnosis not present

## 2021-03-11 DIAGNOSIS — M5442 Lumbago with sciatica, left side: Secondary | ICD-10-CM | POA: Diagnosis not present

## 2021-03-13 DIAGNOSIS — I272 Pulmonary hypertension, unspecified: Secondary | ICD-10-CM | POA: Diagnosis not present

## 2021-03-13 DIAGNOSIS — M48061 Spinal stenosis, lumbar region without neurogenic claudication: Secondary | ICD-10-CM | POA: Diagnosis not present

## 2021-03-13 DIAGNOSIS — D649 Anemia, unspecified: Secondary | ICD-10-CM | POA: Diagnosis not present

## 2021-03-13 DIAGNOSIS — J449 Chronic obstructive pulmonary disease, unspecified: Secondary | ICD-10-CM | POA: Diagnosis not present

## 2021-03-13 DIAGNOSIS — G8929 Other chronic pain: Secondary | ICD-10-CM | POA: Diagnosis not present

## 2021-03-13 DIAGNOSIS — M1711 Unilateral primary osteoarthritis, right knee: Secondary | ICD-10-CM | POA: Diagnosis not present

## 2021-03-13 DIAGNOSIS — M1712 Unilateral primary osteoarthritis, left knee: Secondary | ICD-10-CM | POA: Diagnosis not present

## 2021-03-13 DIAGNOSIS — I10 Essential (primary) hypertension: Secondary | ICD-10-CM | POA: Diagnosis not present

## 2021-03-13 DIAGNOSIS — M5416 Radiculopathy, lumbar region: Secondary | ICD-10-CM | POA: Diagnosis not present

## 2021-03-14 DIAGNOSIS — D649 Anemia, unspecified: Secondary | ICD-10-CM | POA: Diagnosis not present

## 2021-03-14 DIAGNOSIS — M1712 Unilateral primary osteoarthritis, left knee: Secondary | ICD-10-CM | POA: Diagnosis not present

## 2021-03-14 DIAGNOSIS — I10 Essential (primary) hypertension: Secondary | ICD-10-CM | POA: Diagnosis not present

## 2021-03-14 DIAGNOSIS — G8929 Other chronic pain: Secondary | ICD-10-CM | POA: Diagnosis not present

## 2021-03-14 DIAGNOSIS — I272 Pulmonary hypertension, unspecified: Secondary | ICD-10-CM | POA: Diagnosis not present

## 2021-03-14 DIAGNOSIS — M5416 Radiculopathy, lumbar region: Secondary | ICD-10-CM | POA: Diagnosis not present

## 2021-03-14 DIAGNOSIS — M1711 Unilateral primary osteoarthritis, right knee: Secondary | ICD-10-CM | POA: Diagnosis not present

## 2021-03-14 DIAGNOSIS — J449 Chronic obstructive pulmonary disease, unspecified: Secondary | ICD-10-CM | POA: Diagnosis not present

## 2021-03-14 DIAGNOSIS — M48061 Spinal stenosis, lumbar region without neurogenic claudication: Secondary | ICD-10-CM | POA: Diagnosis not present

## 2021-03-17 DIAGNOSIS — M48061 Spinal stenosis, lumbar region without neurogenic claudication: Secondary | ICD-10-CM | POA: Diagnosis not present

## 2021-03-17 DIAGNOSIS — M1711 Unilateral primary osteoarthritis, right knee: Secondary | ICD-10-CM | POA: Diagnosis not present

## 2021-03-17 DIAGNOSIS — I10 Essential (primary) hypertension: Secondary | ICD-10-CM | POA: Diagnosis not present

## 2021-03-17 DIAGNOSIS — I272 Pulmonary hypertension, unspecified: Secondary | ICD-10-CM | POA: Diagnosis not present

## 2021-03-17 DIAGNOSIS — M1712 Unilateral primary osteoarthritis, left knee: Secondary | ICD-10-CM | POA: Diagnosis not present

## 2021-03-17 DIAGNOSIS — D649 Anemia, unspecified: Secondary | ICD-10-CM | POA: Diagnosis not present

## 2021-03-17 DIAGNOSIS — M5416 Radiculopathy, lumbar region: Secondary | ICD-10-CM | POA: Diagnosis not present

## 2021-03-17 DIAGNOSIS — G8929 Other chronic pain: Secondary | ICD-10-CM | POA: Diagnosis not present

## 2021-03-17 DIAGNOSIS — J449 Chronic obstructive pulmonary disease, unspecified: Secondary | ICD-10-CM | POA: Diagnosis not present

## 2021-03-18 DIAGNOSIS — M1711 Unilateral primary osteoarthritis, right knee: Secondary | ICD-10-CM | POA: Diagnosis not present

## 2021-03-18 DIAGNOSIS — M5416 Radiculopathy, lumbar region: Secondary | ICD-10-CM | POA: Diagnosis not present

## 2021-03-18 DIAGNOSIS — I10 Essential (primary) hypertension: Secondary | ICD-10-CM | POA: Diagnosis not present

## 2021-03-18 DIAGNOSIS — M48061 Spinal stenosis, lumbar region without neurogenic claudication: Secondary | ICD-10-CM | POA: Diagnosis not present

## 2021-03-18 DIAGNOSIS — J449 Chronic obstructive pulmonary disease, unspecified: Secondary | ICD-10-CM | POA: Diagnosis not present

## 2021-03-18 DIAGNOSIS — G8929 Other chronic pain: Secondary | ICD-10-CM | POA: Diagnosis not present

## 2021-03-18 DIAGNOSIS — I272 Pulmonary hypertension, unspecified: Secondary | ICD-10-CM | POA: Diagnosis not present

## 2021-03-18 DIAGNOSIS — D649 Anemia, unspecified: Secondary | ICD-10-CM | POA: Diagnosis not present

## 2021-03-18 DIAGNOSIS — M1712 Unilateral primary osteoarthritis, left knee: Secondary | ICD-10-CM | POA: Diagnosis not present

## 2021-03-19 DIAGNOSIS — M1711 Unilateral primary osteoarthritis, right knee: Secondary | ICD-10-CM | POA: Diagnosis not present

## 2021-03-19 DIAGNOSIS — G8929 Other chronic pain: Secondary | ICD-10-CM | POA: Diagnosis not present

## 2021-03-19 DIAGNOSIS — M48061 Spinal stenosis, lumbar region without neurogenic claudication: Secondary | ICD-10-CM | POA: Diagnosis not present

## 2021-03-19 DIAGNOSIS — I272 Pulmonary hypertension, unspecified: Secondary | ICD-10-CM | POA: Diagnosis not present

## 2021-03-19 DIAGNOSIS — M1712 Unilateral primary osteoarthritis, left knee: Secondary | ICD-10-CM | POA: Diagnosis not present

## 2021-03-19 DIAGNOSIS — D649 Anemia, unspecified: Secondary | ICD-10-CM | POA: Diagnosis not present

## 2021-03-19 DIAGNOSIS — J449 Chronic obstructive pulmonary disease, unspecified: Secondary | ICD-10-CM | POA: Diagnosis not present

## 2021-03-19 DIAGNOSIS — I10 Essential (primary) hypertension: Secondary | ICD-10-CM | POA: Diagnosis not present

## 2021-03-19 DIAGNOSIS — M5416 Radiculopathy, lumbar region: Secondary | ICD-10-CM | POA: Diagnosis not present

## 2021-03-20 DIAGNOSIS — M48061 Spinal stenosis, lumbar region without neurogenic claudication: Secondary | ICD-10-CM | POA: Diagnosis not present

## 2021-03-20 DIAGNOSIS — M1712 Unilateral primary osteoarthritis, left knee: Secondary | ICD-10-CM | POA: Diagnosis not present

## 2021-03-20 DIAGNOSIS — M5416 Radiculopathy, lumbar region: Secondary | ICD-10-CM | POA: Diagnosis not present

## 2021-03-20 DIAGNOSIS — G8929 Other chronic pain: Secondary | ICD-10-CM | POA: Diagnosis not present

## 2021-03-20 DIAGNOSIS — M1711 Unilateral primary osteoarthritis, right knee: Secondary | ICD-10-CM | POA: Diagnosis not present

## 2021-03-20 DIAGNOSIS — J449 Chronic obstructive pulmonary disease, unspecified: Secondary | ICD-10-CM | POA: Diagnosis not present

## 2021-03-20 DIAGNOSIS — I10 Essential (primary) hypertension: Secondary | ICD-10-CM | POA: Diagnosis not present

## 2021-03-20 DIAGNOSIS — I272 Pulmonary hypertension, unspecified: Secondary | ICD-10-CM | POA: Diagnosis not present

## 2021-03-20 DIAGNOSIS — D649 Anemia, unspecified: Secondary | ICD-10-CM | POA: Diagnosis not present

## 2021-03-24 DIAGNOSIS — M1712 Unilateral primary osteoarthritis, left knee: Secondary | ICD-10-CM | POA: Diagnosis not present

## 2021-03-24 DIAGNOSIS — J449 Chronic obstructive pulmonary disease, unspecified: Secondary | ICD-10-CM | POA: Diagnosis not present

## 2021-03-24 DIAGNOSIS — G8929 Other chronic pain: Secondary | ICD-10-CM | POA: Diagnosis not present

## 2021-03-24 DIAGNOSIS — I272 Pulmonary hypertension, unspecified: Secondary | ICD-10-CM | POA: Diagnosis not present

## 2021-03-24 DIAGNOSIS — D649 Anemia, unspecified: Secondary | ICD-10-CM | POA: Diagnosis not present

## 2021-03-24 DIAGNOSIS — I10 Essential (primary) hypertension: Secondary | ICD-10-CM | POA: Diagnosis not present

## 2021-03-24 DIAGNOSIS — M1711 Unilateral primary osteoarthritis, right knee: Secondary | ICD-10-CM | POA: Diagnosis not present

## 2021-03-24 DIAGNOSIS — M48061 Spinal stenosis, lumbar region without neurogenic claudication: Secondary | ICD-10-CM | POA: Diagnosis not present

## 2021-03-24 DIAGNOSIS — M5416 Radiculopathy, lumbar region: Secondary | ICD-10-CM | POA: Diagnosis not present

## 2021-03-25 DIAGNOSIS — I10 Essential (primary) hypertension: Secondary | ICD-10-CM | POA: Diagnosis not present

## 2021-03-25 DIAGNOSIS — M48061 Spinal stenosis, lumbar region without neurogenic claudication: Secondary | ICD-10-CM | POA: Diagnosis not present

## 2021-03-25 DIAGNOSIS — J449 Chronic obstructive pulmonary disease, unspecified: Secondary | ICD-10-CM | POA: Diagnosis not present

## 2021-03-25 DIAGNOSIS — I272 Pulmonary hypertension, unspecified: Secondary | ICD-10-CM | POA: Diagnosis not present

## 2021-03-25 DIAGNOSIS — M5416 Radiculopathy, lumbar region: Secondary | ICD-10-CM | POA: Diagnosis not present

## 2021-03-25 DIAGNOSIS — M1712 Unilateral primary osteoarthritis, left knee: Secondary | ICD-10-CM | POA: Diagnosis not present

## 2021-03-25 DIAGNOSIS — D649 Anemia, unspecified: Secondary | ICD-10-CM | POA: Diagnosis not present

## 2021-03-25 DIAGNOSIS — G8929 Other chronic pain: Secondary | ICD-10-CM | POA: Diagnosis not present

## 2021-03-25 DIAGNOSIS — M1711 Unilateral primary osteoarthritis, right knee: Secondary | ICD-10-CM | POA: Diagnosis not present

## 2021-03-26 DIAGNOSIS — M5416 Radiculopathy, lumbar region: Secondary | ICD-10-CM | POA: Diagnosis not present

## 2021-03-26 DIAGNOSIS — M1712 Unilateral primary osteoarthritis, left knee: Secondary | ICD-10-CM | POA: Diagnosis not present

## 2021-03-26 DIAGNOSIS — I10 Essential (primary) hypertension: Secondary | ICD-10-CM | POA: Diagnosis not present

## 2021-03-26 DIAGNOSIS — G8929 Other chronic pain: Secondary | ICD-10-CM | POA: Diagnosis not present

## 2021-03-26 DIAGNOSIS — M48061 Spinal stenosis, lumbar region without neurogenic claudication: Secondary | ICD-10-CM | POA: Diagnosis not present

## 2021-03-26 DIAGNOSIS — J449 Chronic obstructive pulmonary disease, unspecified: Secondary | ICD-10-CM | POA: Diagnosis not present

## 2021-03-26 DIAGNOSIS — D649 Anemia, unspecified: Secondary | ICD-10-CM | POA: Diagnosis not present

## 2021-03-26 DIAGNOSIS — M1711 Unilateral primary osteoarthritis, right knee: Secondary | ICD-10-CM | POA: Diagnosis not present

## 2021-03-26 DIAGNOSIS — I272 Pulmonary hypertension, unspecified: Secondary | ICD-10-CM | POA: Diagnosis not present

## 2021-04-01 DIAGNOSIS — I272 Pulmonary hypertension, unspecified: Secondary | ICD-10-CM | POA: Diagnosis not present

## 2021-04-01 DIAGNOSIS — M1711 Unilateral primary osteoarthritis, right knee: Secondary | ICD-10-CM | POA: Diagnosis not present

## 2021-04-01 DIAGNOSIS — J449 Chronic obstructive pulmonary disease, unspecified: Secondary | ICD-10-CM | POA: Diagnosis not present

## 2021-04-01 DIAGNOSIS — G8929 Other chronic pain: Secondary | ICD-10-CM | POA: Diagnosis not present

## 2021-04-01 DIAGNOSIS — I10 Essential (primary) hypertension: Secondary | ICD-10-CM | POA: Diagnosis not present

## 2021-04-01 DIAGNOSIS — M48061 Spinal stenosis, lumbar region without neurogenic claudication: Secondary | ICD-10-CM | POA: Diagnosis not present

## 2021-04-01 DIAGNOSIS — D649 Anemia, unspecified: Secondary | ICD-10-CM | POA: Diagnosis not present

## 2021-04-01 DIAGNOSIS — M1712 Unilateral primary osteoarthritis, left knee: Secondary | ICD-10-CM | POA: Diagnosis not present

## 2021-04-01 DIAGNOSIS — M5416 Radiculopathy, lumbar region: Secondary | ICD-10-CM | POA: Diagnosis not present

## 2021-04-02 DIAGNOSIS — I10 Essential (primary) hypertension: Secondary | ICD-10-CM | POA: Diagnosis not present

## 2021-04-02 DIAGNOSIS — I272 Pulmonary hypertension, unspecified: Secondary | ICD-10-CM | POA: Diagnosis not present

## 2021-04-07 DIAGNOSIS — D649 Anemia, unspecified: Secondary | ICD-10-CM | POA: Diagnosis not present

## 2021-04-07 DIAGNOSIS — M1711 Unilateral primary osteoarthritis, right knee: Secondary | ICD-10-CM | POA: Diagnosis not present

## 2021-04-07 DIAGNOSIS — G8929 Other chronic pain: Secondary | ICD-10-CM | POA: Diagnosis not present

## 2021-04-07 DIAGNOSIS — I10 Essential (primary) hypertension: Secondary | ICD-10-CM | POA: Diagnosis not present

## 2021-04-07 DIAGNOSIS — I272 Pulmonary hypertension, unspecified: Secondary | ICD-10-CM | POA: Diagnosis not present

## 2021-04-07 DIAGNOSIS — M48061 Spinal stenosis, lumbar region without neurogenic claudication: Secondary | ICD-10-CM | POA: Diagnosis not present

## 2021-04-07 DIAGNOSIS — J449 Chronic obstructive pulmonary disease, unspecified: Secondary | ICD-10-CM | POA: Diagnosis not present

## 2021-04-07 DIAGNOSIS — M1712 Unilateral primary osteoarthritis, left knee: Secondary | ICD-10-CM | POA: Diagnosis not present

## 2021-04-07 DIAGNOSIS — M5416 Radiculopathy, lumbar region: Secondary | ICD-10-CM | POA: Diagnosis not present

## 2021-04-08 DIAGNOSIS — J449 Chronic obstructive pulmonary disease, unspecified: Secondary | ICD-10-CM | POA: Diagnosis not present

## 2021-04-08 DIAGNOSIS — M1711 Unilateral primary osteoarthritis, right knee: Secondary | ICD-10-CM | POA: Diagnosis not present

## 2021-04-08 DIAGNOSIS — D649 Anemia, unspecified: Secondary | ICD-10-CM | POA: Diagnosis not present

## 2021-04-08 DIAGNOSIS — M48061 Spinal stenosis, lumbar region without neurogenic claudication: Secondary | ICD-10-CM | POA: Diagnosis not present

## 2021-04-08 DIAGNOSIS — I10 Essential (primary) hypertension: Secondary | ICD-10-CM | POA: Diagnosis not present

## 2021-04-08 DIAGNOSIS — G8929 Other chronic pain: Secondary | ICD-10-CM | POA: Diagnosis not present

## 2021-04-08 DIAGNOSIS — M5416 Radiculopathy, lumbar region: Secondary | ICD-10-CM | POA: Diagnosis not present

## 2021-04-08 DIAGNOSIS — I272 Pulmonary hypertension, unspecified: Secondary | ICD-10-CM | POA: Diagnosis not present

## 2021-04-08 DIAGNOSIS — M1712 Unilateral primary osteoarthritis, left knee: Secondary | ICD-10-CM | POA: Diagnosis not present

## 2021-04-09 DIAGNOSIS — M1711 Unilateral primary osteoarthritis, right knee: Secondary | ICD-10-CM | POA: Diagnosis not present

## 2021-04-09 DIAGNOSIS — I10 Essential (primary) hypertension: Secondary | ICD-10-CM | POA: Diagnosis not present

## 2021-04-09 DIAGNOSIS — J449 Chronic obstructive pulmonary disease, unspecified: Secondary | ICD-10-CM | POA: Diagnosis not present

## 2021-04-09 DIAGNOSIS — M1712 Unilateral primary osteoarthritis, left knee: Secondary | ICD-10-CM | POA: Diagnosis not present

## 2021-04-09 DIAGNOSIS — I272 Pulmonary hypertension, unspecified: Secondary | ICD-10-CM | POA: Diagnosis not present

## 2021-04-09 DIAGNOSIS — D649 Anemia, unspecified: Secondary | ICD-10-CM | POA: Diagnosis not present

## 2021-04-09 DIAGNOSIS — M5416 Radiculopathy, lumbar region: Secondary | ICD-10-CM | POA: Diagnosis not present

## 2021-04-09 DIAGNOSIS — G8929 Other chronic pain: Secondary | ICD-10-CM | POA: Diagnosis not present

## 2021-04-09 DIAGNOSIS — M48061 Spinal stenosis, lumbar region without neurogenic claudication: Secondary | ICD-10-CM | POA: Diagnosis not present

## 2021-04-14 DIAGNOSIS — J449 Chronic obstructive pulmonary disease, unspecified: Secondary | ICD-10-CM | POA: Diagnosis not present

## 2021-04-14 DIAGNOSIS — G8929 Other chronic pain: Secondary | ICD-10-CM | POA: Diagnosis not present

## 2021-04-14 DIAGNOSIS — M1712 Unilateral primary osteoarthritis, left knee: Secondary | ICD-10-CM | POA: Diagnosis not present

## 2021-04-14 DIAGNOSIS — I10 Essential (primary) hypertension: Secondary | ICD-10-CM | POA: Diagnosis not present

## 2021-04-14 DIAGNOSIS — D649 Anemia, unspecified: Secondary | ICD-10-CM | POA: Diagnosis not present

## 2021-04-14 DIAGNOSIS — M5416 Radiculopathy, lumbar region: Secondary | ICD-10-CM | POA: Diagnosis not present

## 2021-04-14 DIAGNOSIS — I272 Pulmonary hypertension, unspecified: Secondary | ICD-10-CM | POA: Diagnosis not present

## 2021-04-14 DIAGNOSIS — M1711 Unilateral primary osteoarthritis, right knee: Secondary | ICD-10-CM | POA: Diagnosis not present

## 2021-04-14 DIAGNOSIS — M48061 Spinal stenosis, lumbar region without neurogenic claudication: Secondary | ICD-10-CM | POA: Diagnosis not present

## 2021-04-16 DIAGNOSIS — D649 Anemia, unspecified: Secondary | ICD-10-CM | POA: Diagnosis not present

## 2021-04-16 DIAGNOSIS — M1712 Unilateral primary osteoarthritis, left knee: Secondary | ICD-10-CM | POA: Diagnosis not present

## 2021-04-16 DIAGNOSIS — M48061 Spinal stenosis, lumbar region without neurogenic claudication: Secondary | ICD-10-CM | POA: Diagnosis not present

## 2021-04-16 DIAGNOSIS — M5416 Radiculopathy, lumbar region: Secondary | ICD-10-CM | POA: Diagnosis not present

## 2021-04-16 DIAGNOSIS — G8929 Other chronic pain: Secondary | ICD-10-CM | POA: Diagnosis not present

## 2021-04-16 DIAGNOSIS — I10 Essential (primary) hypertension: Secondary | ICD-10-CM | POA: Diagnosis not present

## 2021-04-16 DIAGNOSIS — M1711 Unilateral primary osteoarthritis, right knee: Secondary | ICD-10-CM | POA: Diagnosis not present

## 2021-04-16 DIAGNOSIS — J449 Chronic obstructive pulmonary disease, unspecified: Secondary | ICD-10-CM | POA: Diagnosis not present

## 2021-04-16 DIAGNOSIS — I272 Pulmonary hypertension, unspecified: Secondary | ICD-10-CM | POA: Diagnosis not present

## 2021-04-21 DIAGNOSIS — G8929 Other chronic pain: Secondary | ICD-10-CM | POA: Diagnosis not present

## 2021-04-21 DIAGNOSIS — D649 Anemia, unspecified: Secondary | ICD-10-CM | POA: Diagnosis not present

## 2021-04-21 DIAGNOSIS — M5416 Radiculopathy, lumbar region: Secondary | ICD-10-CM | POA: Diagnosis not present

## 2021-04-21 DIAGNOSIS — I272 Pulmonary hypertension, unspecified: Secondary | ICD-10-CM | POA: Diagnosis not present

## 2021-04-21 DIAGNOSIS — M1712 Unilateral primary osteoarthritis, left knee: Secondary | ICD-10-CM | POA: Diagnosis not present

## 2021-04-21 DIAGNOSIS — I10 Essential (primary) hypertension: Secondary | ICD-10-CM | POA: Diagnosis not present

## 2021-04-21 DIAGNOSIS — M48061 Spinal stenosis, lumbar region without neurogenic claudication: Secondary | ICD-10-CM | POA: Diagnosis not present

## 2021-04-21 DIAGNOSIS — M1711 Unilateral primary osteoarthritis, right knee: Secondary | ICD-10-CM | POA: Diagnosis not present

## 2021-04-21 DIAGNOSIS — J449 Chronic obstructive pulmonary disease, unspecified: Secondary | ICD-10-CM | POA: Diagnosis not present

## 2021-04-22 DIAGNOSIS — M1712 Unilateral primary osteoarthritis, left knee: Secondary | ICD-10-CM | POA: Diagnosis not present

## 2021-04-22 DIAGNOSIS — D649 Anemia, unspecified: Secondary | ICD-10-CM | POA: Diagnosis not present

## 2021-04-22 DIAGNOSIS — G8929 Other chronic pain: Secondary | ICD-10-CM | POA: Diagnosis not present

## 2021-04-22 DIAGNOSIS — I272 Pulmonary hypertension, unspecified: Secondary | ICD-10-CM | POA: Diagnosis not present

## 2021-04-22 DIAGNOSIS — I10 Essential (primary) hypertension: Secondary | ICD-10-CM | POA: Diagnosis not present

## 2021-04-22 DIAGNOSIS — J449 Chronic obstructive pulmonary disease, unspecified: Secondary | ICD-10-CM | POA: Diagnosis not present

## 2021-04-22 DIAGNOSIS — M1711 Unilateral primary osteoarthritis, right knee: Secondary | ICD-10-CM | POA: Diagnosis not present

## 2021-04-22 DIAGNOSIS — M5416 Radiculopathy, lumbar region: Secondary | ICD-10-CM | POA: Diagnosis not present

## 2021-04-22 DIAGNOSIS — M48061 Spinal stenosis, lumbar region without neurogenic claudication: Secondary | ICD-10-CM | POA: Diagnosis not present

## 2021-06-05 DIAGNOSIS — M545 Low back pain, unspecified: Secondary | ICD-10-CM | POA: Diagnosis not present

## 2021-06-05 DIAGNOSIS — Z720 Tobacco use: Secondary | ICD-10-CM | POA: Diagnosis not present

## 2021-06-05 DIAGNOSIS — G8929 Other chronic pain: Secondary | ICD-10-CM | POA: Diagnosis not present

## 2021-06-05 DIAGNOSIS — R634 Abnormal weight loss: Secondary | ICD-10-CM | POA: Diagnosis not present

## 2021-06-05 DIAGNOSIS — D649 Anemia, unspecified: Secondary | ICD-10-CM | POA: Diagnosis not present

## 2021-06-05 DIAGNOSIS — I10 Essential (primary) hypertension: Secondary | ICD-10-CM | POA: Diagnosis not present

## 2021-06-05 DIAGNOSIS — M069 Rheumatoid arthritis, unspecified: Secondary | ICD-10-CM | POA: Diagnosis not present

## 2021-06-05 DIAGNOSIS — J449 Chronic obstructive pulmonary disease, unspecified: Secondary | ICD-10-CM | POA: Diagnosis not present

## 2021-07-14 DIAGNOSIS — D649 Anemia, unspecified: Secondary | ICD-10-CM | POA: Diagnosis not present

## 2021-09-26 DIAGNOSIS — J410 Simple chronic bronchitis: Secondary | ICD-10-CM | POA: Diagnosis not present

## 2021-09-26 DIAGNOSIS — I272 Pulmonary hypertension, unspecified: Secondary | ICD-10-CM | POA: Diagnosis not present

## 2021-09-26 DIAGNOSIS — D649 Anemia, unspecified: Secondary | ICD-10-CM | POA: Diagnosis not present

## 2021-09-26 DIAGNOSIS — M069 Rheumatoid arthritis, unspecified: Secondary | ICD-10-CM | POA: Diagnosis not present

## 2021-09-26 DIAGNOSIS — I10 Essential (primary) hypertension: Secondary | ICD-10-CM | POA: Diagnosis not present

## 2021-09-26 DIAGNOSIS — G4734 Idiopathic sleep related nonobstructive alveolar hypoventilation: Secondary | ICD-10-CM | POA: Diagnosis not present

## 2021-12-02 DIAGNOSIS — D649 Anemia, unspecified: Secondary | ICD-10-CM | POA: Diagnosis not present

## 2021-12-02 DIAGNOSIS — R0789 Other chest pain: Secondary | ICD-10-CM | POA: Diagnosis not present

## 2021-12-02 DIAGNOSIS — R634 Abnormal weight loss: Secondary | ICD-10-CM | POA: Diagnosis not present

## 2021-12-02 DIAGNOSIS — I272 Pulmonary hypertension, unspecified: Secondary | ICD-10-CM | POA: Diagnosis not present

## 2021-12-02 DIAGNOSIS — Z86010 Personal history of colonic polyps: Secondary | ICD-10-CM | POA: Diagnosis not present

## 2021-12-02 DIAGNOSIS — I10 Essential (primary) hypertension: Secondary | ICD-10-CM | POA: Diagnosis not present

## 2021-12-02 DIAGNOSIS — Z85028 Personal history of other malignant neoplasm of stomach: Secondary | ICD-10-CM | POA: Diagnosis not present

## 2021-12-08 DIAGNOSIS — J309 Allergic rhinitis, unspecified: Secondary | ICD-10-CM | POA: Diagnosis not present

## 2021-12-08 DIAGNOSIS — I10 Essential (primary) hypertension: Secondary | ICD-10-CM | POA: Diagnosis not present

## 2021-12-08 DIAGNOSIS — Z1389 Encounter for screening for other disorder: Secondary | ICD-10-CM | POA: Diagnosis not present

## 2021-12-08 DIAGNOSIS — F17210 Nicotine dependence, cigarettes, uncomplicated: Secondary | ICD-10-CM | POA: Diagnosis not present

## 2021-12-08 DIAGNOSIS — D649 Anemia, unspecified: Secondary | ICD-10-CM | POA: Diagnosis not present

## 2021-12-08 DIAGNOSIS — J449 Chronic obstructive pulmonary disease, unspecified: Secondary | ICD-10-CM | POA: Diagnosis not present

## 2021-12-08 DIAGNOSIS — Z0000 Encounter for general adult medical examination without abnormal findings: Secondary | ICD-10-CM | POA: Diagnosis not present

## 2021-12-26 ENCOUNTER — Other Ambulatory Visit: Payer: Self-pay | Admitting: Nurse Practitioner

## 2021-12-26 DIAGNOSIS — D509 Iron deficiency anemia, unspecified: Secondary | ICD-10-CM | POA: Diagnosis not present

## 2021-12-26 DIAGNOSIS — F17209 Nicotine dependence, unspecified, with unspecified nicotine-induced disorders: Secondary | ICD-10-CM | POA: Diagnosis not present

## 2021-12-26 DIAGNOSIS — R634 Abnormal weight loss: Secondary | ICD-10-CM

## 2021-12-26 DIAGNOSIS — R1011 Right upper quadrant pain: Secondary | ICD-10-CM

## 2021-12-30 DIAGNOSIS — R634 Abnormal weight loss: Secondary | ICD-10-CM | POA: Diagnosis not present

## 2022-01-09 ENCOUNTER — Ambulatory Visit
Admission: RE | Admit: 2022-01-09 | Discharge: 2022-01-09 | Disposition: A | Discharge status: Home / Self Care | Payer: Medicare HMO | Source: Ambulatory Visit | Attending: Nurse Practitioner | Admitting: Nurse Practitioner

## 2022-01-09 DIAGNOSIS — K6389 Other specified diseases of intestine: Secondary | ICD-10-CM | POA: Diagnosis not present

## 2022-01-09 DIAGNOSIS — R1011 Right upper quadrant pain: Secondary | ICD-10-CM | POA: Diagnosis not present

## 2022-01-09 DIAGNOSIS — R634 Abnormal weight loss: Secondary | ICD-10-CM | POA: Insufficient documentation

## 2022-01-09 DIAGNOSIS — J439 Emphysema, unspecified: Secondary | ICD-10-CM | POA: Diagnosis not present

## 2022-01-19 ENCOUNTER — Other Ambulatory Visit: Payer: Self-pay | Admitting: Family Medicine

## 2022-01-19 DIAGNOSIS — R9389 Abnormal findings on diagnostic imaging of other specified body structures: Secondary | ICD-10-CM

## 2022-01-19 DIAGNOSIS — Z720 Tobacco use: Secondary | ICD-10-CM

## 2022-01-30 ENCOUNTER — Ambulatory Visit
Admission: RE | Admit: 2022-01-30 | Discharge: 2022-01-30 | Disposition: A | Discharge status: Home / Self Care | Payer: Medicare HMO | Source: Ambulatory Visit | Attending: Family Medicine | Admitting: Family Medicine

## 2022-01-30 DIAGNOSIS — R9389 Abnormal findings on diagnostic imaging of other specified body structures: Secondary | ICD-10-CM

## 2022-01-30 DIAGNOSIS — Z720 Tobacco use: Secondary | ICD-10-CM

## 2022-01-30 DIAGNOSIS — J439 Emphysema, unspecified: Secondary | ICD-10-CM | POA: Diagnosis not present

## 2022-02-09 DIAGNOSIS — H2640 Unspecified secondary cataract: Secondary | ICD-10-CM | POA: Diagnosis not present

## 2022-02-09 DIAGNOSIS — H2513 Age-related nuclear cataract, bilateral: Secondary | ICD-10-CM | POA: Diagnosis not present

## 2022-02-09 DIAGNOSIS — Z0100 Encounter for examination of eyes and vision without abnormal findings: Secondary | ICD-10-CM | POA: Diagnosis not present

## 2022-02-09 DIAGNOSIS — H43823 Vitreomacular adhesion, bilateral: Secondary | ICD-10-CM | POA: Diagnosis not present

## 2023-01-06 ENCOUNTER — Observation Stay
Admission: EM | Admit: 2023-01-06 | Discharge: 2023-01-08 | Disposition: A | Discharge status: Hospice | Payer: 59 | Attending: Internal Medicine | Admitting: Internal Medicine

## 2023-01-06 ENCOUNTER — Emergency Department: Payer: 59

## 2023-01-06 DIAGNOSIS — S32000A Wedge compression fracture of unspecified lumbar vertebra, initial encounter for closed fracture: Secondary | ICD-10-CM

## 2023-01-06 DIAGNOSIS — F17210 Nicotine dependence, cigarettes, uncomplicated: Secondary | ICD-10-CM | POA: Insufficient documentation

## 2023-01-06 DIAGNOSIS — X58XXXA Exposure to other specified factors, initial encounter: Secondary | ICD-10-CM | POA: Diagnosis not present

## 2023-01-06 DIAGNOSIS — J449 Chronic obstructive pulmonary disease, unspecified: Secondary | ICD-10-CM | POA: Diagnosis not present

## 2023-01-06 DIAGNOSIS — I129 Hypertensive chronic kidney disease with stage 1 through stage 4 chronic kidney disease, or unspecified chronic kidney disease: Secondary | ICD-10-CM | POA: Insufficient documentation

## 2023-01-06 DIAGNOSIS — Z85028 Personal history of other malignant neoplasm of stomach: Secondary | ICD-10-CM | POA: Insufficient documentation

## 2023-01-06 DIAGNOSIS — Z7189 Other specified counseling: Secondary | ICD-10-CM

## 2023-01-06 DIAGNOSIS — M545 Low back pain, unspecified: Secondary | ICD-10-CM | POA: Diagnosis present

## 2023-01-06 DIAGNOSIS — I272 Pulmonary hypertension, unspecified: Secondary | ICD-10-CM | POA: Diagnosis present

## 2023-01-06 DIAGNOSIS — Z515 Encounter for palliative care: Secondary | ICD-10-CM

## 2023-01-06 DIAGNOSIS — M5416 Radiculopathy, lumbar region: Secondary | ICD-10-CM | POA: Diagnosis not present

## 2023-01-06 DIAGNOSIS — Z79899 Other long term (current) drug therapy: Secondary | ICD-10-CM | POA: Diagnosis not present

## 2023-01-06 DIAGNOSIS — N189 Chronic kidney disease, unspecified: Secondary | ICD-10-CM | POA: Diagnosis not present

## 2023-01-06 DIAGNOSIS — D649 Anemia, unspecified: Secondary | ICD-10-CM

## 2023-01-06 DIAGNOSIS — I2510 Atherosclerotic heart disease of native coronary artery without angina pectoris: Secondary | ICD-10-CM | POA: Diagnosis not present

## 2023-01-06 DIAGNOSIS — E43 Unspecified severe protein-calorie malnutrition: Secondary | ICD-10-CM | POA: Insufficient documentation

## 2023-01-06 DIAGNOSIS — D500 Iron deficiency anemia secondary to blood loss (chronic): Secondary | ICD-10-CM

## 2023-01-06 DIAGNOSIS — G8929 Other chronic pain: Secondary | ICD-10-CM

## 2023-01-06 DIAGNOSIS — M4800 Spinal stenosis, site unspecified: Secondary | ICD-10-CM | POA: Insufficient documentation

## 2023-01-06 DIAGNOSIS — R0902 Hypoxemia: Secondary | ICD-10-CM

## 2023-01-06 LAB — URINALYSIS, W/ REFLEX TO CULTURE (INFECTION SUSPECTED)
Bacteria, UA: NONE SEEN
Bilirubin Urine: NEGATIVE
Glucose, UA: NEGATIVE mg/dL
Hgb urine dipstick: NEGATIVE
Ketones, ur: 5 mg/dL — AB
Leukocytes,Ua: NEGATIVE
Nitrite: NEGATIVE
Protein, ur: NEGATIVE mg/dL
Specific Gravity, Urine: 1.013 (ref 1.005–1.030)
pH: 6 (ref 5.0–8.0)

## 2023-01-06 LAB — CBC WITH DIFFERENTIAL/PLATELET
Abs Immature Granulocytes: 0.04 10*3/uL (ref 0.00–0.07)
Basophils Absolute: 0 10*3/uL (ref 0.0–0.1)
Basophils Relative: 0 %
Eosinophils Absolute: 0 10*3/uL (ref 0.0–0.5)
Eosinophils Relative: 0 %
HCT: 25.4 % — ABNORMAL LOW (ref 36.0–46.0)
Hemoglobin: 7.5 g/dL — ABNORMAL LOW (ref 12.0–15.0)
Immature Granulocytes: 1 %
Lymphocytes Relative: 7 %
Lymphs Abs: 0.4 10*3/uL — ABNORMAL LOW (ref 0.7–4.0)
MCH: 27.8 pg (ref 26.0–34.0)
MCHC: 29.5 g/dL — ABNORMAL LOW (ref 30.0–36.0)
MCV: 94.1 fL (ref 80.0–100.0)
Monocytes Absolute: 0.5 10*3/uL (ref 0.1–1.0)
Monocytes Relative: 8 %
Neutro Abs: 5 10*3/uL (ref 1.7–7.7)
Neutrophils Relative %: 84 %
Platelets: 170 10*3/uL (ref 150–400)
RBC: 2.7 MIL/uL — ABNORMAL LOW (ref 3.87–5.11)
RDW: 19 % — ABNORMAL HIGH (ref 11.5–15.5)
WBC: 6 10*3/uL (ref 4.0–10.5)
nRBC: 0 % (ref 0.0–0.2)

## 2023-01-06 LAB — COMPREHENSIVE METABOLIC PANEL
ALT: 7 U/L (ref 0–44)
AST: 21 U/L (ref 15–41)
Albumin: 2.8 g/dL — ABNORMAL LOW (ref 3.5–5.0)
Alkaline Phosphatase: 67 U/L (ref 38–126)
Anion gap: 6 (ref 5–15)
BUN: 25 mg/dL — ABNORMAL HIGH (ref 8–23)
CO2: 21 mmol/L — ABNORMAL LOW (ref 22–32)
Calcium: 8.3 mg/dL — ABNORMAL LOW (ref 8.9–10.3)
Chloride: 109 mmol/L (ref 98–111)
Creatinine, Ser: 0.88 mg/dL (ref 0.44–1.00)
GFR, Estimated: >60 mL/min (ref >60)
Glucose, Bld: 95 mg/dL (ref 70–99)
Potassium: 3.7 mmol/L (ref 3.5–5.1)
Sodium: 136 mmol/L (ref 135–145)
Total Bilirubin: 0.9 mg/dL (ref 0.3–1.2)
Total Protein: 5.9 g/dL — ABNORMAL LOW (ref 6.5–8.1)

## 2023-01-06 LAB — TROPONIN I (HIGH SENSITIVITY): Troponin I (High Sensitivity): 52 ng/L — ABNORMAL HIGH (ref <18)

## 2023-01-06 MED ORDER — SODIUM CHLORIDE 0.9 % IV BOLUS
1000.0000 mL | Freq: Once | INTRAVENOUS | Status: AC
  Administered 2023-01-06: 1000 mL via INTRAVENOUS

## 2023-01-06 MED ORDER — METHYLPREDNISOLONE SODIUM SUCC 125 MG IJ SOLR
80.0000 mg | INTRAMUSCULAR | Status: AC
  Administered 2023-01-06: 80 mg via INTRAVENOUS
  Filled 2023-01-06: qty 2

## 2023-01-06 NOTE — ED Notes (Signed)
In and out cath completed with Mitzi Davenport, RN assisting.

## 2023-01-06 NOTE — ED Provider Notes (Signed)
[facility] Provider Note    Event Date/Time   First MD Initiated Contact with Patient 01/06/23 2040     (approximate)   History   Chief Complaint: Back Pain (Back pain x 2 days, states she couldn't get out of bed, Denies injuries. EMS had to physically lift her to stretcher. 75 mcg Fentanyl via EMS, 500cc NS)   HPI  Jasmine [name] is a 82 y.o. female with a history of COPD, pancreatitis, nephrolithiasis, CKD who comes the ED due to right lower back pain for the past 2 days.  Severe, radiates around to the front.  Reports a history of sciatica.  Denies any falls or trauma, no lower extremity weakness or paresthesia.  No incontinence.  No fever.  Denies dysuria     Physical Exam   Triage Vital Signs: ED Triage Vitals  Enc Vitals Group     BP 01/06/23 2040 139/64     Pulse Rate 01/06/23 2040 73     Resp 01/06/23 2040 20     Temp 01/06/23 2041 99.8 F (37.7 C)     Temp Source 01/06/23 2041 Oral     SpO2 01/06/23 2040 92 %     Weight --      Height --      Head Circumference --      Peak Flow --      Pain Score --      Pain Loc --      Pain Edu? --      Excl. in GC? --     Most recent vital signs: Vitals:   01/06/23 2040 01/06/23 2041  BP: 139/64   Pulse: 73   Resp: 20   Temp:  99.8 F (37.7 C)  SpO2: 92%     General: Awake, exhibits severe pain CV:  Good peripheral perfusion.  Regular rate and rhythm, normal distal pulses Resp:  Normal effort.  Clear to auscultation bilaterally Abd:  No distention.  Soft, there is suprapubic tenderness Other:  No lower extremity edema, no calf tenderness.  No muscular tenderness in the lower back.   ED Results / Procedures / Treatments   Labs (all labs ordered are listed, but only abnormal results are displayed) Labs Reviewed  COMPREHENSIVE METABOLIC PANEL - Abnormal; Notable for the following components:      Result Value   CO2 21 (*)    BUN 25 (*)    Calcium 8.3 (*)    Total Protein  5.9 (*)    Albumin 2.8 (*)    All other components within normal limits  CBC WITH DIFFERENTIAL/PLATELET - Abnormal; Notable for the following components:   RBC 2.70 (*)    Hemoglobin 7.5 (*)    HCT 25.4 (*)    MCHC 29.5 (*)    RDW 19.0 (*)    Lymphs Abs 0.4 (*)    All other components within normal limits  URINALYSIS, W/ REFLEX TO CULTURE (INFECTION SUSPECTED) - Abnormal; Notable for the following components:   Color, Urine YELLOW (*)    APPearance CLEAR (*)    Ketones, ur 5 (*)    All other components within normal limits  TROPONIN I (HIGH SENSITIVITY) - Abnormal; Notable for the following components:   Troponin I (High Sensitivity) 52 (*)    All other components within normal limits  TROPONIN I (HIGH SENSITIVITY)     EKG Interpreted by me Sinus rhythm rate of 77.  Normal axis, normal intervals.  Normal QRS ST  segments and T waves.   RADIOLOGY CT abdomen pelvis without contrast interpreted by me, negative for obstructing kidney stone or aortic aneurysm.  Radiology report reviewed, unremarkable   PROCEDURES:  Procedures   MEDICATIONS ORDERED IN ED: Medications  methylPREDNISolone sodium succinate (SOLU-MEDROL) 125 mg/2 mL injection 80 mg (80 mg Intravenous Given 01/06/23 2152)  sodium chloride 0.9 % bolus 1,000 mL (1,000 mLs Intravenous New Bag/Given 01/06/23 2152)     IMPRESSION / MDM / ASSESSMENT AND PLAN / ED COURSE  I reviewed the triage vital signs and the nursing notes.  DDx: Abdominal aortic aneurysm, muscle strain, kidney stone, appendicitis, UTI, pyelonephritis, non-STEMI, sciatica, lumbar compression fracture, AKI  Patient's presentation is most consistent with acute presentation with potential threat to life or bodily function.  Patient presents with severe right lower back pain.  Vital signs unremarkable.  She did get a bit hypoxic after receiving IV fentanyl from EMS and was placed on nasal cannula.  She is alert and responsive.  Patient given Solu-Medrol  for premedication in case CT angiogram is needed.  Labs show anemia with a hemoglobin of 7.5.  Chemistry panel unremarkable.  ----------------------------------------- 11:44 PM on 01/06/2023 ----------------------------------------- Patient resting comfortably.  Oxygen saturation 100% with nasal cannula.  Breathing comfortably.  Labs unremarkable, UA normal.  Will obtain MRI L-spine, if negative patient can be discharged to follow-up with primary care.  Family member at bedside notes that patient's chronic sciatica pain has been managed with periodic spinal injections, and the patient is due for one and needs an appointment.       FINAL CLINICAL IMPRESSION(S) / ED DIAGNOSES   Final diagnoses:  Lumbar radiculopathy     Rx / DC Orders   ED Discharge Orders     None        Note:  This document was prepared using Dragon voice recognition software and may include unintentional dictation errors.   Sharman Cheek, MD 01/06/23 [phone]

## 2023-01-07 ENCOUNTER — Other Ambulatory Visit: Payer: Self-pay

## 2023-01-07 ENCOUNTER — Emergency Department: Payer: 59

## 2023-01-07 ENCOUNTER — Encounter: Payer: Self-pay | Admitting: Internal Medicine

## 2023-01-07 DIAGNOSIS — Z85028 Personal history of other malignant neoplasm of stomach: Secondary | ICD-10-CM | POA: Insufficient documentation

## 2023-01-07 DIAGNOSIS — G8929 Other chronic pain: Secondary | ICD-10-CM

## 2023-01-07 DIAGNOSIS — Z7189 Other specified counseling: Secondary | ICD-10-CM

## 2023-01-07 DIAGNOSIS — M5416 Radiculopathy, lumbar region: Secondary | ICD-10-CM

## 2023-01-07 DIAGNOSIS — S32000A Wedge compression fracture of unspecified lumbar vertebra, initial encounter for closed fracture: Secondary | ICD-10-CM

## 2023-01-07 DIAGNOSIS — D649 Anemia, unspecified: Secondary | ICD-10-CM

## 2023-01-07 DIAGNOSIS — D500 Iron deficiency anemia secondary to blood loss (chronic): Secondary | ICD-10-CM

## 2023-01-07 DIAGNOSIS — J449 Chronic obstructive pulmonary disease, unspecified: Secondary | ICD-10-CM

## 2023-01-07 DIAGNOSIS — Z515 Encounter for palliative care: Secondary | ICD-10-CM

## 2023-01-07 DIAGNOSIS — R0902 Hypoxemia: Secondary | ICD-10-CM

## 2023-01-07 DIAGNOSIS — E43 Unspecified severe protein-calorie malnutrition: Secondary | ICD-10-CM

## 2023-01-07 DIAGNOSIS — M4800 Spinal stenosis, site unspecified: Secondary | ICD-10-CM | POA: Insufficient documentation

## 2023-01-07 LAB — CBC
HCT: 25.9 % — ABNORMAL LOW (ref 36.0–46.0)
Hemoglobin: 7.7 g/dL — ABNORMAL LOW (ref 12.0–15.0)
MCH: 27.8 pg (ref 26.0–34.0)
MCHC: 29.7 g/dL — ABNORMAL LOW (ref 30.0–36.0)
MCV: 93.5 fL (ref 80.0–100.0)
Platelets: 174 10*3/uL (ref 150–400)
RBC: 2.77 MIL/uL — ABNORMAL LOW (ref 3.87–5.11)
RDW: 18.6 % — ABNORMAL HIGH (ref 11.5–15.5)
WBC: 5.8 10*3/uL (ref 4.0–10.5)
nRBC: 0 % (ref 0.0–0.2)

## 2023-01-07 LAB — RETICULOCYTES
Immature Retic Fract: 26.6 % — ABNORMAL HIGH (ref 2.3–15.9)
RBC.: 2.85 MIL/uL — ABNORMAL LOW (ref 3.87–5.11)
Retic Count, Absolute: 65.6 10*3/uL (ref 19.0–186.0)
Retic Ct Pct: 2.3 % (ref 0.4–3.1)

## 2023-01-07 LAB — TROPONIN I (HIGH SENSITIVITY): Troponin I (High Sensitivity): 62 ng/L — ABNORMAL HIGH (ref <18)

## 2023-01-07 LAB — IRON AND TIBC
Iron: 24 ug/dL — ABNORMAL LOW (ref 28–170)
Saturation Ratios: 7 % — ABNORMAL LOW (ref 10.4–31.8)
TIBC: 328 ug/dL (ref 250–450)
UIBC: 304 ug/dL

## 2023-01-07 LAB — FERRITIN: Ferritin: 6 ng/mL — ABNORMAL LOW (ref 11–307)

## 2023-01-07 LAB — VITAMIN B12: Vitamin B-12: 5039 pg/mL — ABNORMAL HIGH (ref 180–914)

## 2023-01-07 LAB — FOLATE: Folate: 6.7 ng/mL (ref >5.9)

## 2023-01-07 MED ORDER — ALBUTEROL SULFATE HFA 108 (90 BASE) MCG/ACT IN AERS: 2.0000 | INHALATION_SPRAY | RESPIRATORY_TRACT | Status: DC | PRN

## 2023-01-07 MED ORDER — BISOPROLOL-HYDROCHLOROTHIAZIDE 10-6.25 MG PO TABS
0.5000 | ORAL_TABLET | Freq: Every day | ORAL | Status: DC
  Filled 2023-01-07 (×2): qty 1

## 2023-01-07 MED ORDER — HYDROCHLOROTHIAZIDE 12.5 MG PO TABS
6.2500 mg | ORAL_TABLET | Freq: Every day | ORAL | Status: DC
  Administered 2023-01-08: 6.25 mg via ORAL
  Filled 2023-01-07 (×2): qty 1

## 2023-01-07 MED ORDER — ENOXAPARIN SODIUM 40 MG/0.4ML IJ SOSY
40.0000 mg | PREFILLED_SYRINGE | INTRAMUSCULAR | Status: DC
  Administered 2023-01-07 – 2023-01-08 (×2): 40 mg via SUBCUTANEOUS
  Filled 2023-01-07 (×2): qty 0.4

## 2023-01-07 MED ORDER — BISOPROLOL FUMARATE 5 MG PO TABS
5.0000 mg | ORAL_TABLET | Freq: Every day | ORAL | Status: DC
  Administered 2023-01-08: 5 mg via ORAL
  Filled 2023-01-07 (×2): qty 1

## 2023-01-07 MED ORDER — ALBUTEROL SULFATE (2.5 MG/3ML) 0.083% IN NEBU: 2.5000 mg | INHALATION_SOLUTION | RESPIRATORY_TRACT | Status: DC | PRN

## 2023-01-07 MED ORDER — MORPHINE SULFATE (PF) 2 MG/ML IV SOLN
2.0000 mg | INTRAVENOUS | Status: DC | PRN
  Filled 2023-01-07: qty 1

## 2023-01-07 MED ORDER — UMECLIDINIUM BROMIDE 62.5 MCG/ACT IN AEPB
1.0000 | INHALATION_SPRAY | Freq: Every day | RESPIRATORY_TRACT | Status: DC
  Administered 2023-01-07 – 2023-01-08 (×2): 1 via RESPIRATORY_TRACT
  Filled 2023-01-07: qty 7

## 2023-01-07 MED ORDER — ACETAMINOPHEN 650 MG RE SUPP: 650.0000 mg | Freq: Four times a day (QID) | RECTAL | Status: DC | PRN

## 2023-01-07 MED ORDER — HYDROCODONE-ACETAMINOPHEN 5-325 MG PO TABS
1.0000 | ORAL_TABLET | ORAL | Status: DC | PRN
  Administered 2023-01-07: 2 via ORAL
  Administered 2023-01-07 – 2023-01-08 (×3): 1 via ORAL
  Administered 2023-01-08: 2 via ORAL
  Filled 2023-01-07: qty 1
  Filled 2023-01-07: qty 2
  Filled 2023-01-07: qty 1
  Filled 2023-01-07: qty 2
  Filled 2023-01-07 (×2): qty 1

## 2023-01-07 MED ORDER — ORAL CARE MOUTH RINSE: 15.0000 mL | OROMUCOSAL | Status: DC | PRN

## 2023-01-07 MED ORDER — BOOST / RESOURCE BREEZE PO LIQD CUSTOM
1.0000 | Freq: Three times a day (TID) | ORAL | Status: DC
  Administered 2023-01-07 – 2023-01-08 (×2): 1 via ORAL

## 2023-01-07 MED ORDER — ADULT MULTIVITAMIN W/MINERALS CH
1.0000 | ORAL_TABLET | Freq: Every day | ORAL | Status: DC
  Administered 2023-01-08: 1 via ORAL
  Filled 2023-01-07 (×2): qty 1

## 2023-01-07 MED ORDER — ONDANSETRON HCL 4 MG/2ML IJ SOLN: 4.0000 mg | Freq: Four times a day (QID) | INTRAMUSCULAR | Status: DC | PRN

## 2023-01-07 MED ORDER — ENSURE ENLIVE PO LIQD
237.0000 mL | Freq: Two times a day (BID) | ORAL | Status: DC
  Administered 2023-01-07: 237 mL via ORAL

## 2023-01-07 MED ORDER — FERROUS SULFATE 325 (65 FE) MG PO TABS
325.0000 mg | ORAL_TABLET | Freq: Every day | ORAL | Status: DC
  Administered 2023-01-07 – 2023-01-08 (×2): 325 mg via ORAL
  Filled 2023-01-07 (×2): qty 1

## 2023-01-07 MED ORDER — ARFORMOTEROL TARTRATE 15 MCG/2ML IN NEBU
15.0000 ug | INHALATION_SOLUTION | Freq: Two times a day (BID) | RESPIRATORY_TRACT | Status: DC
  Administered 2023-01-07 – 2023-01-08 (×2): 15 ug via RESPIRATORY_TRACT
  Filled 2023-01-07 (×4): qty 2

## 2023-01-07 MED ORDER — ONDANSETRON HCL 4 MG PO TABS: 4.0000 mg | ORAL_TABLET | Freq: Four times a day (QID) | ORAL | Status: DC | PRN

## 2023-01-07 MED ORDER — VITAMIN B-12 1000 MCG PO TABS
1000.0000 ug | ORAL_TABLET | Freq: Every day | ORAL | Status: DC
  Administered 2023-01-07 – 2023-01-08 (×2): 1000 ug via ORAL
  Filled 2023-01-07 (×3): qty 1

## 2023-01-07 MED ORDER — ACETAMINOPHEN 325 MG PO TABS: 650.0000 mg | ORAL_TABLET | Freq: Four times a day (QID) | ORAL | Status: DC | PRN

## 2023-01-07 NOTE — Progress Notes (Addendum)
Initial Nutrition Assessment  DOCUMENTATION CODES:   Severe malnutrition in context of chronic illness  INTERVENTION:   Boost Breeze po TID, each supplement provides 250 kcal and 9 grams of protein  Magic cup TID with meals, each supplement provides 290 kcal and 9 grams of protein  MVI po daily   Mechanical soft diet   Snacks (10am, 2pm, 8pm)  Pt at high refeed risk; recommend monitor potassium, magnesium and phosphorus labs daily until stable  Daily weights   NUTRITION DIAGNOSIS:   Severe Malnutrition related to chronic illness as evidenced by severe fat depletion, severe muscle depletion, 10 percent weight loss in 6 months.  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 pylori, DDD and spinal stenosis who is admitted with back pain and weakness and was found to have compression fracture.  Met with pt and pt's son in room today. Pt reports decreased appetite and oral intake for several months pta r/t poor appetite and early satiety. Pt s/p gastrectomy many years ago and reports that she is unable to eat large meals at baseline; pt usually eats snacks and small meals throughout the day. Per chart, pt's UBW appears to be ~175lbs. Pt reports weight loss over the past several years. Per chart, pt is down 16lbs(10%) over the past 6 months; this is significant weight loss. Son at bedside reports that patient eats only bites and mainly sips on liquids. Pt has only had some tea to drink today; pt reports that she does not like juice. Son reports that patient has Ensure at home but patient reports that she does not really like  it as it "tastes chalky" and only drinks one a day, maybe. Pt is requesting to try a different supplement in hospital. Pt is willing to try Boost Breeze and [employer]. Pt is requesting a mechanical soft diet r/t poor dentition. RD will add supplements and MVI to help pt meet her estimated needs. Pt is at high refeed risk.   Medications reviewed and include: B12, lovenox, ferrous sulfate, hydrochlorothiazide  Labs reviewed: K 3.7 wnl, BUN 25(H) Iron 24(L), TIBC 328, ferritin 6(L), folate 6.7 wnl, B12 5039(H) Hgb 7.7(L), Hct 25.9(L)  NUTRITION - FOCUSED PHYSICAL EXAM:  Flowsheet Row Most Recent Value  Orbital Region Moderate depletion  Upper Arm Region Severe depletion  Thoracic and Lumbar Region Moderate depletion  Buccal Region Moderate depletion  Temple Region Moderate depletion  Clavicle Bone Region Severe depletion  Clavicle and Acromion Bone Region Severe depletion  Scapular Bone Region Moderate depletion  Dorsal Hand Severe depletion  Patellar Region Severe depletion  Anterior Thigh Region Severe depletion  Posterior Calf Region Severe depletion  Edema (RD Assessment) None  Hair Reviewed  Eyes Reviewed  Mouth Reviewed  Skin Reviewed  Nails Reviewed   Diet Order:   Diet Order             Diet Heart Room service appropriate? Yes; Fluid consistency: Thin  Diet effective now                  EDUCATION NEEDS:   Education needs have been addressed  Skin:  Skin Assessment: Reviewed RN Assessment  Last BM:  7/1  Height:   Ht Readings from Last 1 Encounters:  01/07/23 5\' 3"  (1.6 m)    Weight:   Wt Readings from Last 1 Encounters:  01/07/23 49.1 kg    Ideal Body Weight:  52.3 kg  BMI:  Body mass index is 19.17 kg/m.  Estimated Nutritional Needs:   Kcal:  1400-1600kcal/day  Protein:  70-80g/day  Fluid:  1.3-1.5L/day  Betsey Holiday MS, RD, LDN Please refer to [facility] for RD and/or RD on-call/weekend/after hours pager

## 2023-01-07 NOTE — Assessment & Plan Note (Deleted)
S/p upper and lower endoscopy 2019 showing nonbleeding duodenal angiectasia and multiple colonic polyps respectively No acute issues suspected

## 2023-01-07 NOTE — Progress Notes (Signed)
Civil engineer, contracting [facility]) Hospital Liaison Note  Received request from Ashby Dawes, RN, Transitions of Care Manager, for hospice services at home after discharge.  Spoke with patient son and daughter to initiate education related to hospice philosophy, services, and team approach to care.  Patient/family verbalized understanding of information given.  Per discussion, the plan is for discharge home by EMS tomorrow, 7.5.24  DME needs discussed.  Patient has the following equipment in the home: elevated commode seat, grab bars in tub and a shower chair.   Patient/family requests the following equipment in the home: AV nurse to assess.  The address has been verified and is correct in the chart. Tyrone Schimke and phone number [phone] is the family contact to arrange time of equipment delivery.    Please send signed and completed DNR home with the patient/family.  Please provide prescriptions at discharge as needed to ensure ongoing symptom management.   [facility] information and contact numbers given to daughter.   Above information shared with Ashby Dawes, RN, Transitions of Care Manager.     Please call with any Hospice related questions or concerns.  Thank you for the opportunity to participate in this patient's care.  Redge Gainer, [facility] Liaison [phone]

## 2023-01-07 NOTE — Assessment & Plan Note (Addendum)
History of gastric cancer Hemoglobin has been downtrending over the past few months since December 2003 Anemia panel 10.7 (06/2022)> 8.7 (6/19)> 7.5 Continue to trend and transfuse if under 7 Last upper and lower endoscopy was in 2019 and was notable for nonbleeding duodenal angiectasia and multiple colonic polyps

## 2023-01-07 NOTE — IPAL (Signed)
  Interdisciplinary Goals of Care Family Meeting   Date carried out: 01/07/2023  Location of the meeting: Bedside  Member's involved: Physician and Family Member or next of kin  Durable Power of Attorney or Environmental health practitioner: Daughter    Discussion: We discussed goals of care for Jasmine [name]   Code status:   Code Status: DNR   Disposition: Home with Hospice  Time spent for the meeting: 35 mins    Jasmine Lovett, MD  01/07/2023, 1:21 PM

## 2023-01-07 NOTE — IPAL (Signed)
  Interdisciplinary Goals of Care Family Meeting   Date carried out: 01/07/2023  Location of the meeting: Bedside  Member's involved: Physician and Family Member or next of kin  Durable Power of Attorney or acting medical decision maker:   TEFL teacher and [employer])  Discussion: We discussed goals of care for Jasmine [name] .   I have reviewed medical records including EPIC notes, labs and imaging, assessed the patient and then met with Jasmine [name] (daughter and POA) and granddaughter to discuss major active diagnoses, plan of care, natural trajectory, prognosis, GOC, EOL wishes, disposition and options including Full code/DNI/DNR and the concept of comfort care if DNR is elected. Questions and concerns were addressed. They are  in agreement to continue current plan of care . Election for DNR status.  Code status:   Code Status: DNR   Disposition: Home  Time spent for the meeting: 30    Andris Baumann, MD  01/07/2023, 2:23 AM

## 2023-01-07 NOTE — ED Notes (Signed)
Patient transported to MRI 

## 2023-01-07 NOTE — Congregational Nurse Program (Signed)
Received order to place external cathether from Dr Sherryll Burger

## 2023-01-07 NOTE — Assessment & Plan Note (Signed)
COPD Pulmonary hypertension O2 sats 92 on room air.  Not wheezing and no pneumonia on chest x-ray Review of last cardiology note reveals O2 sat in the 80s on room air Continue supplemental oxygen and wean as tolerated Continue home inhalers with DuoNebs as needed

## 2023-01-07 NOTE — H&P (Addendum)
History and Physical    Patient: Jasmine [name] DOB: 19-May-1941 DOA: 01/06/2023 DOS: the patient was seen and examined on 01/07/2023 PCP: Dorothey Baseman, MD  Patient coming from: Home  Chief Complaint:  Chief Complaint  Patient presents with   Back Pain    Back pain x 2 days, states she couldn't get out of bed, Denies injuries. EMS had to physically lift her to stretcher. 75 mcg Fentanyl via EMS, 500cc NS    HPI: Jasmine [name] is a 82 y.o. female with medical history significant for HTN, anemia, gastric cancer, COPD, chronic back pain from spinal stenosis and compression fractures requiring epidural steroid injections in the past and on multimodal pain management, who presents to the ED with progressive worsening of the back pain over the past 2 weeks.  History is given by daughter at the bedside who states that at baseline, patient lives alone, ambulates with a walker and is unable to independently manage her IADLs and ADLs, however they noticed today her pain and decreased mobility has worsened to where she was unable to get out of the bed thus prompting the visit to the emergency room.  She has had no falls.  She was not recently ill, specifically has had no cough, fever or chills or chest pain or shortness of breath and no nausea, vomiting or abdominal pain or diarrhea or dysuria.  EMS reportedly had to lift her into the stretcher and they administered 75 mcg of fentanyl en route. ED course and data review: Vitals notable for Tmax of 99.8.  O2 sat on room air was 92 for which she was placed on 2 L with improvement 100%.  Labs notable for hemoglobin of 7.5 which is down from 8.7 a couple weeks prior on 6/19 and down from 10.7 in December 2023.  CMP unremarkable.  Urinalysis sterile.  Troponin 52-62. EKG, personally viewed and interpreted showing sinus at 77 with nonspecific ST-T wave changes. MRI lumbar spine compression fracture at L1 5 in addition to prior chronic  findings IMPRESSION: 1. New wedge compression fracture at L5 with mild bone marrow edema. 2. Unchanged severe spinal canal stenosis at L4-L5 with moderate right and severe left neural foraminal stenosis. 3. Unchanged moderate spinal canal stenosis and severe left neural foraminal stenosis at L3-L4. 4. Unchanged severe bilateral L5-S1 neural foraminal stenosis.  Patient was treated with fentanyl in the ED but was noted to be hypoxic and placed on 2 L.  She was also given methylprednisolone and an IV fluid bolus. Hospitalist consulted for admission.       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, senile    Chronic kidney disease    COPD (chronic obstructive pulmonary disease) (HCC)    De Quervain's tenosynovitis, bilateral    History of colon polyps 01/20/2018   Nephrolithiasis    Pancreatitis    Spinal stenosis    Stomach cancer (HCC) 1989   Past Surgical History:  Procedure Laterality Date   ABDOMINAL HYSTERECTOMY     billroth 1 hemigastrectomy     BLADDER SURGERY     COLONOSCOPY WITH PROPOFOL N/A 02/20/2015   Procedure: COLONOSCOPY WITH PROPOFOL;  Surgeon: Scot Jun, MD;  Location: [facility] ENDOSCOPY;  Service: Endoscopy;  Laterality: N/A;   COLONOSCOPY WITH PROPOFOL N/A 03/21/2018   Procedure: COLONOSCOPY WITH PROPOFOL;  Surgeon: Scot Jun, MD;  Location: [facility] ENDOSCOPY;  Service: Endoscopy;  Laterality: N/A;   ESOPHAGOGASTRODUODENOSCOPY  02/20/2015   Procedure: ESOPHAGOGASTRODUODENOSCOPY (EGD);  Surgeon: Scot Jun, MD;  Location: [facility] ENDOSCOPY;  Service: Endoscopy;;   ESOPHAGOGASTRODUODENOSCOPY (EGD) WITH PROPOFOL N/A 03/21/2018   Procedure: ESOPHAGOGASTRODUODENOSCOPY (EGD) WITH PROPOFOL;  Surgeon: Scot Jun, MD;  Location: [facility] ENDOSCOPY;  Service: Endoscopy;  Laterality: N/A;   kidney stone removed     lt.knee surgery     SHOULDER ACROMIOPLASTY     WRIST SURGERY     Social History:   reports that she has been smoking cigarettes. She has a 30.00 pack-year smoking history. She has never used smokeless tobacco. She reports that she does not drink alcohol and does not use drugs.  Allergies  Allergen Reactions   Iohexol      Code: HIVES, Desc: IVP DYE     No family history on file.  Prior to Admission medications   Medication Sig Start Date End Date Taking? Authorizing Provider  albuterol (PROVENTIL HFA;VENTOLIN HFA) 108 (90 BASE) MCG/ACT inhaler Inhale 2 puffs into the lungs every 4 (four) hours as needed for wheezing or shortness of breath.   Yes [provider]  bisoprolol-hydrochlorothiazide (ZIAC) 10-6.25 MG tablet Take 0.5 tablets by mouth daily. 09/02/22  Yes [provider]  cyanocobalamin (VITAMIN B12) 1000 MCG tablet Take 1,000 mcg by mouth daily.   Yes [provider]  ferrous sulfate 325 (65 FE) MG tablet Take 325 mg by mouth daily with breakfast.   Yes [provider]  fluticasone (FLONASE) 50 MCG/ACT nasal spray Place 2 sprays into both nostrils daily. 07/13/22  Yes [provider]  STIOLTO RESPIMAT 2.5-2.5 MCG/ACT AERS Inhale 2 each into the lungs daily. 12/23/22  Yes [provider]    Physical Exam: Vitals:   01/06/23 2040 01/06/23 2041 01/07/23 0000  BP: 139/64  (!) 155/56  Pulse: 73  (!) 52  Resp: 20  16  Temp:  99.8 F (37.7 C)   TempSrc:  Oral   SpO2: 92%  100%   Physical Exam Vitals and nursing note reviewed.  Constitutional:      General: She is sleeping. She is not in acute distress. HENT:     Head: Normocephalic and atraumatic.  Cardiovascular:     Rate and Rhythm: Normal rate and regular rhythm.     Heart sounds: Normal heart sounds.  Pulmonary:     Effort: Pulmonary effort is normal.     Breath sounds: Normal breath sounds.  Abdominal:     Palpations: Abdomen is soft.     Tenderness: There is no abdominal tenderness.  Neurological:     Mental Status: She is easily aroused.      Labs on Admission: I have personally reviewed following labs and imaging studies  CBC: Recent Labs  Lab 01/06/23 2104  WBC 6.0  NEUTROABS 5.0  HGB 7.5*  HCT 25.4*  MCV 94.1  PLT 170   Basic Metabolic Panel: Recent Labs  Lab 01/06/23 2104  NA 136  K 3.7  CL 109  CO2 21*  GLUCOSE 95  BUN 25*  CREATININE 0.88  CALCIUM 8.3*   GFR: CrCl cannot be calculated (Unknown ideal weight.). Liver Function Tests: Recent Labs  Lab 01/06/23 2104  AST 21  ALT 7  ALKPHOS 67  BILITOT 0.9  PROT 5.9*  ALBUMIN 2.8*   No results for input(s): "LIPASE", "AMYLASE" in the last 168 hours. No results for input(s): "AMMONIA" in the last 168 hours. Coagulation Profile: No results for input(s): "INR", "PROTIME"  in the last 168 hours. Cardiac Enzymes: No results for input(s): "CKTOTAL", "CKMB", "CKMBINDEX", "TROPONINI" in the last 168 hours. BNP (last 3 results) No results for input(s): "PROBNP" in the last 8760 hours. HbA1C: No results for input(s): "HGBA1C" in the last 72 hours. CBG: No results for input(s): "GLUCAP" in the last 168 hours. Lipid Profile: No results for input(s): "CHOL", "HDL", "LDLCALC", "TRIG", "CHOLHDL", "LDLDIRECT" in the last 72 hours. Thyroid Function Tests: No results for input(s): "TSH", "T4TOTAL", "FREET4", "T3FREE", "THYROIDAB" in the last 72 hours. Anemia Panel: No results for input(s): "VITAMINB12", "FOLATE", "FERRITIN", "TIBC", "IRON", "RETICCTPCT" in the last 72 hours. Urine analysis:    Component Value Date/Time   COLORURINE YELLOW (A) 01/06/2023 2244   APPEARANCEUR CLEAR (A) 01/06/2023 2244   APPEARANCEUR Cloudy 04/10/2012 1442   LABSPEC 1.013 01/06/2023 2244   LABSPEC 1.025 04/10/2012 1442   PHURINE 6.0 01/06/2023 2244   GLUCOSEU NEGATIVE 01/06/2023 2244   GLUCOSEU Negative 04/10/2012 1442   HGBUR NEGATIVE 01/06/2023 2244   BILIRUBINUR NEGATIVE 01/06/2023 2244   BILIRUBINUR Negative 04/10/2012 1442   KETONESUR 5 (A) 01/06/2023 2244    PROTEINUR NEGATIVE 01/06/2023 2244   NITRITE NEGATIVE 01/06/2023 2244   LEUKOCYTESUR NEGATIVE 01/06/2023 2244   LEUKOCYTESUR Negative 04/10/2012 1442    Radiological Exams on Admission: MR LUMBAR SPINE WO CONTRAST  Result Date: 01/07/2023 CLINICAL DATA:  Lumbar radiculopathy.  Sciatica. EXAM: MRI LUMBAR SPINE WITHOUT CONTRAST TECHNIQUE: Multiplanar, multisequence MR imaging of the lumbar spine was performed. No intravenous contrast was administered. COMPARISON:  01/01/2021 FINDINGS: Segmentation:  Standard. Alignment:  Grade 1 anterolisthesis at L3-4 and L4-5 Vertebrae: Chronic compression deformity at L4 is unchanged. There is a new wedge compression fracture at L5 with mild bone marrow edema. Conus medullaris and cauda equina: Conus extends to the L1 level. Buckling of the cauda equina above the L3-4 level, improved since 01/01/2021. Paraspinal and other soft tissues: Negative Disc levels: L1-L2: Normal disc space and facet joints. No spinal canal stenosis. No neural foraminal stenosis. L2-L3: Small disc bulge, unchanged. No spinal canal stenosis. Mild bilateral neural foraminal stenosis. L3-L4: Disc bulge and retropulsion of L4, unchanged. Unchanged moderate spinal canal stenosis. Unchanged mild right and severe left neural foraminal stenosis. L4-L5: Intermediate sized disc bulge with moderate facet hypertrophy. Unchanged severe spinal canal stenosis. Unchanged moderate right and severe left neural foraminal stenosis. L5-S1: Small disc bulge with endplate spurring. No spinal canal stenosis. Unchanged severe bilateral neural foraminal stenosis. Visualized sacrum: Normal. IMPRESSION: 1. New wedge compression fracture at L5 with mild bone marrow edema. 2. Unchanged severe spinal canal stenosis at L4-L5 with moderate right and severe left neural foraminal stenosis. 3. Unchanged moderate spinal canal stenosis and severe left neural foraminal stenosis at L3-L4. 4. Unchanged severe bilateral L5-S1 neural  foraminal stenosis. Electronically Signed   By: Deatra Robinson M.D.   On: 01/07/2023 01:00   CT ABDOMEN PELVIS WO CONTRAST  Result Date: 01/06/2023 CLINICAL DATA:  Abdominal pain EXAM: CT ABDOMEN AND PELVIS WITHOUT CONTRAST TECHNIQUE: Multidetector CT imaging of the abdomen and pelvis was performed following the standard protocol without IV contrast. RADIATION DOSE REDUCTION: This exam was performed according to the departmental dose-optimization program which includes automated exposure control, adjustment of the mA and/or kV according to patient size and/or use of iterative reconstruction technique. COMPARISON:  01/09/2022 FINDINGS: Lower chest: Small left and trace right pleural effusions. Associated patchy lingular and left lower lobe opacity, possibly atelectasis, although pneumonia is not excluded. Mild right basilar atelectasis. Hepatobiliary: Unenhanced liver  is unremarkable. Gallbladder is unremarkable. No intrahepatic or extrahepatic dilatation. Pancreas: Within normal limits. Spleen: Within normal limits. Adrenals/Urinary Tract: Adrenal glands are within normal limits. Kidneys are within normal limits. No renal calculi or hydronephrosis. Bladder is within normal limits. Stomach/Bowel: Tiny hiatal hernia. Postsurgical changes involving the mid/distal stomach. No evidence of bowel obstruction. Normal appendix (series 2/image 50). No colonic wall thickening or inflammatory changes. Vascular/Lymphatic: No evidence of abdominal aortic aneurysm. Atherosclerotic calcifications of the abdominal aorta and branch vessels. No suspicious abdominopelvic lymphadenopathy. Reproductive: Status post hysterectomy. Chronic right ovarian/adnexal mass in the right deep pelvis, measuring 6.1 x 4.6 cm (series 2/image 60), previously 5.6 x 3.8 cm. Left ovary is not discretely visualized. Other: No abdominopelvic ascites. Musculoskeletal: Moderate to severe compression fracture deformities at L4 and L5, chronic. Degenerative  changes of the lumbar spine. IMPRESSION: No CT findings to account for the patient's abdominal pain. 6.1 cm right ovarian/adnexal mass, chronic but progressive. Outpatient GYN consultation is suggested. Small left and trace right pleural effusions. Associated patchy lingular and left lower lobe opacity, possibly atelectasis, although pneumonia is not excluded. Electronically Signed   By: Charline Bills M.D.   On: 01/06/2023 21:39     Data Reviewed: Relevant notes from primary care and specialist visits, past discharge summaries as available in EHR, including Care Everywhere. Prior diagnostic testing as pertinent to current admission diagnoses Updated medications and problem lists for reconciliation ED course, including vitals, labs, imaging, treatment and response to treatment Triage notes, nursing and pharmacy notes and ED provider's notes Notable results as noted in HPI   Assessment and Plan: * Lumbar compression fracture, closed, initial encounter (HCC) Chronic back pain secondary to spinal canal stenosis and foraminal stenosis MRI lumbar spine showing compression fracture L5 with mild bone marrow edema Pain control Neurosurgical consult  Hypoxia COPD Pulmonary hypertension O2 sats 92 on room air.  Not wheezing and no pneumonia on chest x-ray Review of last cardiology note reveals O2 sat in the 80s on room air Continue supplemental oxygen and wean as tolerated Continue home inhalers with DuoNebs as needed   Anemia History of gastric cancer Hemoglobin has been downtrending over the past few months since December 2003 Anemia panel 10.7 (06/2022)> 8.7 (6/19)> 7.5 Continue to trend and transfuse if under 7 Last upper and lower endoscopy was in 2019 and was notable for nonbleeding duodenal angiectasia and multiple colonic polyps      DVT prophylaxis: Lovenox  Consults: neurosurgery  Advance Care Planning: DNR  Family Communication: Daughter Elease Hashimoto at  bedside  Disposition Plan: Back to previous home environment  Severity of Illness: The appropriate patient status for this patient is OBSERVATION. Observation status is judged to be reasonable and necessary in order to provide the required intensity of service to ensure the patient's safety. The patient's presenting symptoms, physical exam findings, and initial radiographic and laboratory data in the context of their medical condition is felt to place them at decreased risk for further clinical deterioration. Furthermore, it is anticipated that the patient will be medically stable for discharge from the hospital within 2 midnights of admission.   Author: Andris Baumann, MD 01/07/2023 2:16 AM  For on call review [email or web address].

## 2023-01-07 NOTE — ED Provider Notes (Signed)
-----------------------------------------   1:38 AM on 01/07/2023 -----------------------------------------   MRI with new L5 compression fracture.  Troponin increasing.  Given myriad of symptoms including intractable back pain, hypoxia requiring oxygen status post fentanyl administration and rising troponin, have consulted hospitalist services for evaluation and admission.   Irean Hong, MD 01/07/23 [phone]

## 2023-01-07 NOTE — Consult Note (Signed)
Consulting Department:  Internal Medicine  Primary Physician:  Dorothey Baseman, MD  Chief Complaint:  L5 Compression Fracture  History of Present Illness: 01/07/2023 Jasmine [name] is a 82 y.o. female who presents with the chief complaint of back pain.  She is a very complicated past medical history including hypertension, anemia, gastric cancer, COPD, pulmonary hypertension, coronary artery disease.  She has been following with pain management.  Has had multiple injections.  2 weeks of worsening back pain.  At baseline patient lives alone, ambulates with a walker and does not manage her IADLs and ADLs.  Feel that her ambulation is getting worse, no falls.  No recent illnesses.,  No red flag signs or symptoms.  No new bowel or bladder issues.  No radiating pain down her legs.  The symptoms are causing a significant impact on the patient's life.   Review of Systems:  A 10 point review of systems is negative, except for the pertinent positives and negatives detailed in the HPI.  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, senile    Chronic kidney disease    COPD (chronic obstructive pulmonary disease) (HCC)    De Quervain's tenosynovitis, bilateral    History of colon polyps 01/20/2018   Nephrolithiasis    Pancreatitis    Spinal stenosis    Stomach cancer (HCC) 1989    Past Surgical History: Past Surgical History:  Procedure Laterality Date   ABDOMINAL HYSTERECTOMY     billroth 1 hemigastrectomy     BLADDER SURGERY     COLONOSCOPY WITH PROPOFOL N/A 02/20/2015   Procedure: COLONOSCOPY WITH PROPOFOL;  Surgeon: Scot Jun, MD;  Location: [facility] ENDOSCOPY;  Service: Endoscopy;  Laterality: N/A;   COLONOSCOPY WITH PROPOFOL N/A 03/21/2018   Procedure: COLONOSCOPY WITH PROPOFOL;  Surgeon: Scot Jun, MD;  Location: [facility] ENDOSCOPY;  Service: Endoscopy;  Laterality: N/A;    ESOPHAGOGASTRODUODENOSCOPY  02/20/2015   Procedure: ESOPHAGOGASTRODUODENOSCOPY (EGD);  Surgeon: Scot Jun, MD;  Location: [facility] ENDOSCOPY;  Service: Endoscopy;;   ESOPHAGOGASTRODUODENOSCOPY (EGD) WITH PROPOFOL N/A 03/21/2018   Procedure: ESOPHAGOGASTRODUODENOSCOPY (EGD) WITH PROPOFOL;  Surgeon: Scot Jun, MD;  Location: [facility] ENDOSCOPY;  Service: Endoscopy;  Laterality: N/A;   kidney stone removed     lt.knee surgery     SHOULDER ACROMIOPLASTY     WRIST SURGERY      Allergies: Allergies as of 01/06/2023 - Review Complete 01/06/2023  Allergen Reaction Noted   Iohexol  02/07/2009    Medications:  Current Facility-Administered Medications:    acetaminophen (TYLENOL) tablet 650 mg, 650 mg, Oral, Q6H PRN **OR** acetaminophen (TYLENOL) suppository 650 mg, 650 mg, Rectal, Q6H PRN, Andris Baumann, MD   albuterol (PROVENTIL) (2.5 MG/3ML) 0.083% nebulizer solution 2.5 mg, 2.5 mg, Nebulization, Q2H PRN, Andris Baumann, MD   arformoterol (BROVANA) nebulizer solution 15 mcg, 15 mcg, Nebulization, BID **AND** umeclidinium bromide (INCRUSE ELLIPTA) 62.5 MCG/ACT 1 puff, 1 puff, Inhalation, Daily, Andris Baumann, MD   bisoprolol-hydrochlorothiazide [facility]) 10-6.25 MG per tablet 0.5 tablet, 0.5 tablet, Oral, Daily, Andris Baumann, MD   cyanocobalamin (VITAMIN B12) tablet 1,000 mcg, 1,000 mcg, Oral, Daily, Lindajo Royal V, MD   enoxaparin (LOVENOX) injection 40 mg, 40 mg, Subcutaneous, Q24H, Para March, Hazel V, MD   feeding supplement (ENSURE ENLIVE / ENSURE PLUS) liquid 237 mL, 237 mL, Oral, BID BM, Andris Baumann, MD   ferrous sulfate tablet  325 mg, 325 mg, Oral, Q breakfast, Andris Baumann, MD   HYDROcodone-acetaminophen (NORCO/VICODIN) 5-325 MG per tablet 1-2 tablet, 1-2 tablet, Oral, Q4H PRN, Andris Baumann, MD   morphine (PF) 2 MG/ML injection 2 mg, 2 mg, Intravenous, Q2H PRN, Andris Baumann, MD   ondansetron (ZOFRAN) tablet 4 mg, 4 mg, Oral, Q6H PRN **OR** ondansetron (ZOFRAN)  injection 4 mg, 4 mg, Intravenous, Q6H PRN, Andris Baumann, MD   Oral care mouth rinse, 15 mL, Mouth Rinse, PRN, Andris Baumann, MD   Social History: Social History   Tobacco Use   Smoking status: Every Day    Packs/day: 0.50    Years: 60.00    Additional pack years: 0.00    Total pack years: 30.00    Types: Cigarettes   Smokeless tobacco: Never  Substance Use Topics   Alcohol use: No   Drug use: No    Family Medical History: History reviewed. No pertinent family history.  Physical Examination: Vitals:   01/07/23 0350 01/07/23 0800  BP: (!) 143/53 (!) 147/59  Pulse: 60 (!) 52  Resp: 17 14  Temp: 98.2 F (36.8 C) 98.1 F (36.7 C)  SpO2: 99% 100%     General: Patient is well developed, well nourished, calm, collected, and in no apparent distress.  NEUROLOGICAL:  General: In no acute distress.   Awake, alert, oriented to person, place.  Pupils equal round and reactive to light.  Facial tone is symmetric.   Spine is tender to palpation in the midline low back  Strength:*On her physical exam she does have some limitation to pain, however is able to activate a full strength in every tested muscle groups  Side Iliopsoas Quads Hamstring PF DF   R 5 5 5 5 5    L 5 5 5 5 5      Bilateral upper and lower extremity sensation is intact to light touch. Reflexes are 1+  patella and achilles.   Clonus is not present.  Toes are down-going.     Imaging: Narrative & Impression  CLINICAL DATA:  Abdominal pain   EXAM: CT ABDOMEN AND PELVIS WITHOUT CONTRAST   TECHNIQUE: Multidetector CT imaging of the abdomen and pelvis was performed following the standard protocol without IV contrast.   RADIATION DOSE REDUCTION: This exam was performed according to the departmental dose-optimization program which includes automated exposure control, adjustment of the mA and/or kV according to patient size and/or use of iterative reconstruction technique.   COMPARISON:  01/09/2022    FINDINGS: Lower chest: Small left and trace right pleural effusions. Associated patchy lingular and left lower lobe opacity, possibly atelectasis, although pneumonia is not excluded. Mild right basilar atelectasis.   Hepatobiliary: Unenhanced liver is unremarkable.   Gallbladder is unremarkable. No intrahepatic or extrahepatic dilatation.   Pancreas: Within normal limits.   Spleen: Within normal limits.   Adrenals/Urinary Tract: Adrenal glands are within normal limits.   Kidneys are within normal limits. No renal calculi or hydronephrosis.   Bladder is within normal limits.   Stomach/Bowel: Tiny hiatal hernia. Postsurgical changes involving the mid/distal stomach.   No evidence of bowel obstruction.   Normal appendix (series 2/image 50).   No colonic wall thickening or inflammatory changes.   Vascular/Lymphatic: No evidence of abdominal aortic aneurysm.   Atherosclerotic calcifications of the abdominal aorta and branch vessels.   No suspicious abdominopelvic lymphadenopathy.   Reproductive: Status post hysterectomy.   Chronic right ovarian/adnexal mass in the right deep pelvis, measuring 6.1 x  4.6 cm (series 2/image 60), previously 5.6 x 3.8 cm.   Left ovary is not discretely visualized.   Other: No abdominopelvic ascites.   Musculoskeletal: Moderate to severe compression fracture deformities at L4 and L5, chronic. Degenerative changes of the lumbar spine.   IMPRESSION: No CT findings to account for the patient's abdominal pain.   6.1 cm right ovarian/adnexal mass, chronic but progressive. Outpatient GYN consultation is suggested.   Small left and trace right pleural effusions. Associated patchy lingular and left lower lobe opacity, possibly atelectasis, although pneumonia is not excluded.     Electronically Signed   By: Charline Bills M.D.   On: 01/06/2023 21:39   Narrative & Impression  CLINICAL DATA:  Lumbar radiculopathy.  Sciatica.    EXAM: MRI LUMBAR SPINE WITHOUT CONTRAST   TECHNIQUE: Multiplanar, multisequence MR imaging of the lumbar spine was performed. No intravenous contrast was administered.   COMPARISON:  01/01/2021   FINDINGS: Segmentation:  Standard.   Alignment:  Grade 1 anterolisthesis at L3-4 and L4-5   Vertebrae: Chronic compression deformity at L4 is unchanged. There is a new wedge compression fracture at L5 with mild bone marrow edema.   Conus medullaris and cauda equina: Conus extends to the L1 level. Buckling of the cauda equina above the L3-4 level, improved since 01/01/2021.   Paraspinal and other soft tissues: Negative   Disc levels:   L1-L2: Normal disc space and facet joints. No spinal canal stenosis. No neural foraminal stenosis.   L2-L3: Small disc bulge, unchanged. No spinal canal stenosis. Mild bilateral neural foraminal stenosis.   L3-L4: Disc bulge and retropulsion of L4, unchanged. Unchanged moderate spinal canal stenosis. Unchanged mild right and severe left neural foraminal stenosis.   L4-L5: Intermediate sized disc bulge with moderate facet hypertrophy. Unchanged severe spinal canal stenosis. Unchanged moderate right and severe left neural foraminal stenosis.   L5-S1: Small disc bulge with endplate spurring. No spinal canal stenosis. Unchanged severe bilateral neural foraminal stenosis.   Visualized sacrum: Normal.   IMPRESSION: 1. New wedge compression fracture at L5 with mild bone marrow edema. 2. Unchanged severe spinal canal stenosis at L4-L5 with moderate right and severe left neural foraminal stenosis. 3. Unchanged moderate spinal canal stenosis and severe left neural foraminal stenosis at L3-L4. 4. Unchanged severe bilateral L5-S1 neural foraminal stenosis.     Electronically Signed   By: Deatra Robinson M.D.   On: 01/07/2023 01:00        I have personally reviewed the images and agree with the above interpretation.  Labs:    Latest Ref Rng  & Units 01/07/2023    4:48 AM 01/06/2023    9:04 PM 11/08/2016    8:39 PM  CBC  WBC 4.0 - 10.5 K/uL 5.8  6.0  6.2   Hemoglobin 12.0 - 15.0 g/dL 7.7  7.5  16.1   Hematocrit 36.0 - 46.0 % 25.9  25.4  42.0   Platelets 150 - 400 K/uL 174  170  185        Assessment and Plan: Ms. Homstad is a pleasant 82 y.o. female with a very complicated past medical history including COPD, pulmonary hypertension, anemia, gastric cancer, who presents with worsening back pain.  At baseline she has difficulty ambulating and uses assistance but given her new onset of pain has had worsening.  She had a CT scan which demonstrated anterior wedge fracture and an MRI follow-up demonstrating no changes in her known L4-5 stenosis.  This anterior wedge fracture.  Recommendations  for her new L5 anterior wedge fracture, 1.  Recommend LSO for comfort 2.  No need for emergent or urgent surgical intervention 3.  Could consider consultation for possible kyphoplasty 4.  Can follow-up in neurosurgery clinic with upright x-rays 5.  Encourage PT and nutrition optimization    Lovenia Kim, MD/MSCR Dept. of Neurosurgery

## 2023-01-07 NOTE — Assessment & Plan Note (Signed)
Chronic back pain secondary to spinal canal stenosis and foraminal stenosis MRI lumbar spine showing compression fracture L5 with mild bone marrow edema Pain control Neurosurgical consult

## 2023-01-07 NOTE — Progress Notes (Signed)
Same day rounding progress note  Patient seen and examined.  Please see Dr. Lianne Bushy dictated history and physical for further details.  I agree with her assessment and plan.  Family is in agreement with hospice at home.  This has been set up.  Plan on discharge tomorrow home with hospice.  Time spent 10 minutes

## 2023-01-08 DIAGNOSIS — R0902 Hypoxemia: Secondary | ICD-10-CM | POA: Diagnosis not present

## 2023-01-08 DIAGNOSIS — M5416 Radiculopathy, lumbar region: Secondary | ICD-10-CM | POA: Diagnosis not present

## 2023-01-08 DIAGNOSIS — Z515 Encounter for palliative care: Secondary | ICD-10-CM

## 2023-01-08 DIAGNOSIS — S32000A Wedge compression fracture of unspecified lumbar vertebra, initial encounter for closed fracture: Secondary | ICD-10-CM | POA: Diagnosis not present

## 2023-01-08 MED ORDER — LORAZEPAM 0.5 MG PO TABS
0.50 mg | ORAL_TABLET | ORAL | 0 refills | Status: AC | PRN
Start: 2023-01-08 — End: 2023-01-11

## 2023-01-08 MED ORDER — MORPHINE SULFATE (CONCENTRATE) 20 MG/ML PO SOLN
5.0000 mg | ORAL | 0 refills | Status: AC | PRN
Start: 2023-01-08 — End: 2023-01-11

## 2023-01-08 MED ORDER — SENNOSIDES 8.6 MG PO TABS: 2.0000 | ORAL_TABLET | Freq: Two times a day (BID) | ORAL | 0 refills | Status: DC

## 2023-01-08 NOTE — Progress Notes (Signed)
[facility]- Civil engineer, contracting [facility])  Patient will dc home later this evening via EMS.  Family requested  today home 02 and a hospital bed.  Home 02, hospital bed and over the bed table will be delivered later this afternoon.    Please don't hesitate to call with any Hospice related questions or concerns.    Thank you for the opportunity to participate in this patient's care. [facility] Liaison [phone]

## 2023-01-08 NOTE — TOC Progression Note (Signed)
Transition of Care [facility]) - Progression Note    Patient Details  Name: Jasmine [name] MRN: [id number] Date of Birth: May 08, 1941  Transition [facility] CM/SW Contact  Marlowe Sax, RN Phone Number: 01/08/2023, 12:02 PM  Clinical Narrative:     I spoke with the patient and her daughter in the room, They had told the bedside nurse that they wanted to go to rehab that she does not have the help needed at home,  I explained to the patient and her daughter that in order for Ins to cover rehab she would need to be able to work with therapy and make progress, they both agreed that she would not be able to work with therapy.  They plan to go home with hospice, they stated that they would need a hospital bed, I explained that Hospice will get that set up for them, I encouraged them to reach out to friends and family to help with care and also explained what [facility] services were and that they are by the hour paid care, they stated understanding.  They are going to go home with [facility], I notified Ree Kida with [employer] that she is requesting a hospital bed       Expected Discharge Plan and Services         Expected Discharge Date: 01/08/23                                     Social Determinants of Health (SDOH) Interventions SDOH Screenings   Food Insecurity: No Food Insecurity (01/07/2023)  Housing: Low Risk  (01/07/2023)  Transportation Needs: No Transportation Needs (01/07/2023)  Utilities: Not At Risk (01/07/2023)  Tobacco Use: High Risk (01/07/2023)    Readmission Risk Interventions     No data to display

## 2023-01-08 NOTE — Progress Notes (Signed)
Pt picked up by non-emergency medical transport.  Pt discharged with IV access.   Drivers notified and reported that they would remove IV once she arrived home.

## 2023-01-09 NOTE — Discharge Summary (Signed)
Physician Discharge Summary   Patient: Jasmine [name] MRN: [id number] DOB: 10/17/1940  Admit date:     01/06/2023  Discharge date: 01/08/2023  Discharge Physician: Delfino Lovett   PCP: Dorothey Baseman, MD   Recommendations at discharge:   Home with hospice  Discharge Diagnoses: Principal Problem:   Lumbar compression fracture, closed, initial encounter [facility]) Active Problems:   Chronic back pain   Hypoxia   Pulmonary hypertension, moderate to severe (HCC)   COPD (chronic obstructive pulmonary disease) (HCC)   Anemia   Spinal stenosis   History of gastric cancer   Hospice care   Lumbar radiculopathy   Protein-calorie malnutrition, severe  Hospital Course: Assessment and Plan: * Lumbar compression fracture, closed, initial encounter (HCC) Chronic back pain secondary to spinal canal stenosis and foraminal stenosis MRI lumbar spine showing compression fracture L5 with mild bone marrow edema Pain management with hospice Neurosurgical seen and recommended LSO brace for comfort.  Hypoxia COPD Pulmonary hypertension O2 sats 92 on room air.  Not wheezing and no pneumonia on chest x-ray  Anemia of chronic disease History of gastric cancer Hemoglobin has been downtrending over the past few months since December 2003 Anemia panel 10.7 (06/2022)> 8.7 (6/19)> 7.5 Last upper and lower endoscopy was in 2019 and was notable for nonbleeding duodenal angiectasia and multiple colonic polyps  Patient and family chose hospice services at home.  She is being discharged home in stable condition.       Consultants: Neurosurgery Disposition: Hospice care Diet recommendation:  Discharge Diet Orders (From admission, onward)     Start     Ordered   01/08/23 0000  Diet - low sodium heart healthy        01/08/23 1002           Carb modified diet DISCHARGE MEDICATION: Allergies as of 01/08/2023       Reactions   Iohexol     Code: HIVES, Desc: IVP DYE        Medication List      TAKE these medications    albuterol 108 (90 Base) MCG/ACT inhaler Commonly known as: VENTOLIN HFA Inhale 2 puffs into the lungs every 4 (four) hours as needed for wheezing or shortness of breath.   bisoprolol-hydrochlorothiazide 10-6.25 MG tablet Commonly known as: ZIAC Take 0.5 tablets by mouth daily.   cyanocobalamin 1000 MCG tablet Commonly known as: VITAMIN B12 Take 1,000 mcg by mouth daily.   ferrous sulfate 325 (65 FE) MG tablet Take 325 mg by mouth daily with breakfast.   fluticasone 50 MCG/ACT nasal spray Commonly known as: FLONASE Place 2 sprays into both nostrils daily.   LORazepam 0.5 MG tablet Commonly known as: ATIVAN Take 1 tablet (0.5 mg total) by mouth every 4 (four) hours as needed for up to 3 days for anxiety. May crush, mix with water and give sublingually if needed.   morphine 20 MG/ML concentrated solution Commonly known as: ROXANOL Take 0.25 mLs (5 mg total) by mouth every 3 (three) hours as needed for up to 3 days for severe pain, breakthrough pain, anxiety, shortness of breath or moderate pain. May give sublingually if needed.   senna 8.6 MG tablet Commonly known as: Senokot Take 2 tablets (17.2 mg total) by mouth 2 (two) times daily. May crush, mix with water and give sublingually if needed.   Stiolto Respimat 2.5-2.5 MCG/ACT Aers Generic drug: Tiotropium Bromide-Olodaterol Inhale 2 each into the lungs daily.        Follow-up Information  Dorothey Baseman, MD. Schedule an appointment as soon as possible for a visit in 1 week(s).   Specialty: Family Medicine Why: [facility] Discharge F/UP Contact information: [address] [place] [address] [phone]                Discharge Exam: Ceasar Mons Weights   01/07/23 0347  Weight: 104.7 kg   82 year old cachectic female lying in the bed in moderate pain Lungs clear to auscultation bilaterally Heart regular rate and rhythm Abdomen soft, benign Neuro alert and awake,  nonfocal Back: Spine is tender to palpation in the midline lower back area. Skin no obvious rash or lesion  Condition at discharge: poor  The results of significant diagnostics from this hospitalization (including imaging, microbiology, ancillary and laboratory) are listed below for reference.   Imaging Studies: MR LUMBAR SPINE WO CONTRAST  Result Date: 01/07/2023 CLINICAL DATA:  Lumbar radiculopathy.  Sciatica. EXAM: MRI LUMBAR SPINE WITHOUT CONTRAST TECHNIQUE: Multiplanar, multisequence MR imaging of the lumbar spine was performed. No intravenous contrast was administered. COMPARISON:  01/01/2021 FINDINGS: Segmentation:  Standard. Alignment:  Grade 1 anterolisthesis at L3-4 and L4-5 Vertebrae: Chronic compression deformity at L4 is unchanged. There is a new wedge compression fracture at L5 with mild bone marrow edema. Conus medullaris and cauda equina: Conus extends to the L1 level. Buckling of the cauda equina above the L3-4 level, improved since 01/01/2021. Paraspinal and other soft tissues: Negative Disc levels: L1-L2: Normal disc space and facet joints. No spinal canal stenosis. No neural foraminal stenosis. L2-L3: Small disc bulge, unchanged. No spinal canal stenosis. Mild bilateral neural foraminal stenosis. L3-L4: Disc bulge and retropulsion of L4, unchanged. Unchanged moderate spinal canal stenosis. Unchanged mild right and severe left neural foraminal stenosis. L4-L5: Intermediate sized disc bulge with moderate facet hypertrophy. Unchanged severe spinal canal stenosis. Unchanged moderate right and severe left neural foraminal stenosis. L5-S1: Small disc bulge with endplate spurring. No spinal canal stenosis. Unchanged severe bilateral neural foraminal stenosis. Visualized sacrum: Normal. IMPRESSION: 1. New wedge compression fracture at L5 with mild bone marrow edema. 2. Unchanged severe spinal canal stenosis at L4-L5 with moderate right and severe left neural foraminal stenosis. 3. Unchanged  moderate spinal canal stenosis and severe left neural foraminal stenosis at L3-L4. 4. Unchanged severe bilateral L5-S1 neural foraminal stenosis. Electronically Signed   By: Deatra Robinson M.D.   On: 01/07/2023 01:00   CT ABDOMEN PELVIS WO CONTRAST  Result Date: 01/06/2023 CLINICAL DATA:  Abdominal pain EXAM: CT ABDOMEN AND PELVIS WITHOUT CONTRAST TECHNIQUE: Multidetector CT imaging of the abdomen and pelvis was performed following the standard protocol without IV contrast. RADIATION DOSE REDUCTION: This exam was performed according to the departmental dose-optimization program which includes automated exposure control, adjustment of the mA and/or kV according to patient size and/or use of iterative reconstruction technique. COMPARISON:  01/09/2022 FINDINGS: Lower chest: Small left and trace right pleural effusions. Associated patchy lingular and left lower lobe opacity, possibly atelectasis, although pneumonia is not excluded. Mild right basilar atelectasis. Hepatobiliary: Unenhanced liver is unremarkable. Gallbladder is unremarkable. No intrahepatic or extrahepatic dilatation. Pancreas: Within normal limits. Spleen: Within normal limits. Adrenals/Urinary Tract: Adrenal glands are within normal limits. Kidneys are within normal limits. No renal calculi or hydronephrosis. Bladder is within normal limits. Stomach/Bowel: Tiny hiatal hernia. Postsurgical changes involving the mid/distal stomach. No evidence of bowel obstruction. Normal appendix (series 2/image 50). No colonic wall thickening or inflammatory changes. Vascular/Lymphatic: No evidence of abdominal aortic aneurysm. Atherosclerotic calcifications of the abdominal aorta  and branch vessels. No suspicious abdominopelvic lymphadenopathy. Reproductive: Status post hysterectomy. Chronic right ovarian/adnexal mass in the right deep pelvis, measuring 6.1 x 4.6 cm (series 2/image 60), previously 5.6 x 3.8 cm. Left ovary is not discretely visualized. Other: No  abdominopelvic ascites. Musculoskeletal: Moderate to severe compression fracture deformities at L4 and L5, chronic. Degenerative changes of the lumbar spine. IMPRESSION: No CT findings to account for the patient's abdominal pain. 6.1 cm right ovarian/adnexal mass, chronic but progressive. Outpatient GYN consultation is suggested. Small left and trace right pleural effusions. Associated patchy lingular and left lower lobe opacity, possibly atelectasis, although pneumonia is not excluded. Electronically Signed   By: Charline Bills M.D.   On: 01/06/2023 21:39    Microbiology: No results found for this or any previous visit.  Labs: CBC: Recent Labs  Lab 01/06/23 2104 01/07/23 0448  WBC 6.0 5.8  NEUTROABS 5.0  --   HGB 7.5* 7.7*  HCT 25.4* 25.9*  MCV 94.1 93.5  PLT 170 174   Basic Metabolic Panel: Recent Labs  Lab 01/06/23 2104  NA 136  K 3.7  CL 109  CO2 21*  GLUCOSE 95  BUN 25*  CREATININE 0.88  CALCIUM 8.3*   Liver Function Tests: Recent Labs  Lab 01/06/23 2104  AST 21  ALT 7  ALKPHOS 67  BILITOT 0.9  PROT 5.9*  ALBUMIN 2.8*   CBG: No results for input(s): "GLUCAP" in the last 168 hours.  Discharge time spent: greater than 30 minutes.  Signed: Delfino Lovett, MD [facility] 01/09/2023

## 2023-01-29 ENCOUNTER — Other Ambulatory Visit: Payer: Self-pay

## 2023-01-29 DIAGNOSIS — M5416 Radiculopathy, lumbar region: Secondary | ICD-10-CM

## 2023-01-29 DIAGNOSIS — S32000A Wedge compression fracture of unspecified lumbar vertebra, initial encounter for closed fracture: Secondary | ICD-10-CM

## 2023-02-09 ENCOUNTER — Ambulatory Visit: Payer: 59 | Admitting: Neurosurgery

## 2023-02-10 ENCOUNTER — Ambulatory Visit: Payer: 59 | Admitting: Neurosurgery

## 2023-03-06 ENCOUNTER — Observation Stay
Admission: EM | Admit: 2023-03-06 | Discharge: 2023-03-07 | Disposition: A | Discharge status: Home / Self Care | Attending: Internal Medicine | Admitting: Internal Medicine

## 2023-03-06 ENCOUNTER — Emergency Department

## 2023-03-06 ENCOUNTER — Other Ambulatory Visit: Payer: Self-pay

## 2023-03-06 DIAGNOSIS — R079 Chest pain, unspecified: Principal | ICD-10-CM

## 2023-03-06 DIAGNOSIS — Z79899 Other long term (current) drug therapy: Secondary | ICD-10-CM | POA: Insufficient documentation

## 2023-03-06 DIAGNOSIS — J449 Chronic obstructive pulmonary disease, unspecified: Secondary | ICD-10-CM | POA: Diagnosis not present

## 2023-03-06 DIAGNOSIS — E43 Unspecified severe protein-calorie malnutrition: Secondary | ICD-10-CM | POA: Insufficient documentation

## 2023-03-06 DIAGNOSIS — F17210 Nicotine dependence, cigarettes, uncomplicated: Secondary | ICD-10-CM | POA: Insufficient documentation

## 2023-03-06 DIAGNOSIS — D500 Iron deficiency anemia secondary to blood loss (chronic): Secondary | ICD-10-CM | POA: Diagnosis not present

## 2023-03-06 DIAGNOSIS — D649 Anemia, unspecified: Secondary | ICD-10-CM

## 2023-03-06 DIAGNOSIS — N189 Chronic kidney disease, unspecified: Secondary | ICD-10-CM | POA: Diagnosis not present

## 2023-03-06 DIAGNOSIS — I129 Hypertensive chronic kidney disease with stage 1 through stage 4 chronic kidney disease, or unspecified chronic kidney disease: Secondary | ICD-10-CM | POA: Diagnosis not present

## 2023-03-06 DIAGNOSIS — Z85028 Personal history of other malignant neoplasm of stomach: Secondary | ICD-10-CM | POA: Diagnosis present

## 2023-03-06 DIAGNOSIS — Z515 Encounter for palliative care: Secondary | ICD-10-CM

## 2023-03-06 LAB — CBC
HCT: 22 % — ABNORMAL LOW (ref 36.0–46.0)
Hemoglobin: 6.6 g/dL — ABNORMAL LOW (ref 12.0–15.0)
MCH: 28.2 pg (ref 26.0–34.0)
MCHC: 30 g/dL (ref 30.0–36.0)
MCV: 94 fL (ref 80.0–100.0)
Platelets: 187 10*3/uL (ref 150–400)
RBC: 2.34 MIL/uL — ABNORMAL LOW (ref 3.87–5.11)
RDW: 18.7 % — ABNORMAL HIGH (ref 11.5–15.5)
WBC: 4.7 10*3/uL (ref 4.0–10.5)
nRBC: 0 % (ref 0.0–0.2)

## 2023-03-06 LAB — TROPONIN I (HIGH SENSITIVITY)
Troponin I (High Sensitivity): 15 ng/L (ref <18)
Troponin I (High Sensitivity): 20 ng/L — ABNORMAL HIGH (ref <18)

## 2023-03-06 LAB — BASIC METABOLIC PANEL
Anion gap: 7 (ref 5–15)
BUN: 25 mg/dL — ABNORMAL HIGH (ref 8–23)
CO2: 20 mmol/L — ABNORMAL LOW (ref 22–32)
Calcium: 8.2 mg/dL — ABNORMAL LOW (ref 8.9–10.3)
Chloride: 110 mmol/L (ref 98–111)
Creatinine, Ser: 0.99 mg/dL (ref 0.44–1.00)
GFR, Estimated: 57 mL/min — ABNORMAL LOW (ref >60)
Glucose, Bld: 93 mg/dL (ref 70–99)
Potassium: 3.8 mmol/L (ref 3.5–5.1)
Sodium: 137 mmol/L (ref 135–145)

## 2023-03-06 LAB — ABO/RH: ABO/RH(D): O POS

## 2023-03-06 MED ORDER — PANTOPRAZOLE SODIUM 40 MG IV SOLR: 40.0000 mg | INTRAVENOUS | Status: DC

## 2023-03-06 MED ORDER — PANTOPRAZOLE INFUSION (NEW) - SIMPLE MED
8.0000 mg/h | INTRAVENOUS | Status: DC
  Administered 2023-03-07: 8 mg/h via INTRAVENOUS
  Filled 2023-03-06: qty 100

## 2023-03-06 MED ORDER — ARFORMOTEROL TARTRATE 15 MCG/2ML IN NEBU
15.0000 ug | INHALATION_SOLUTION | Freq: Two times a day (BID) | RESPIRATORY_TRACT | Status: DC
  Filled 2023-03-06 (×2): qty 2

## 2023-03-06 MED ORDER — PANTOPRAZOLE 80MG IVPB - SIMPLE MED
80.0000 mg | Freq: Once | INTRAVENOUS | Status: AC
  Administered 2023-03-06: 80 mg via INTRAVENOUS
  Filled 2023-03-06: qty 100

## 2023-03-06 MED ORDER — ACETAMINOPHEN 325 MG RE SUPP: 650.0000 mg | Freq: Four times a day (QID) | RECTAL | Status: DC | PRN

## 2023-03-06 MED ORDER — UMECLIDINIUM BROMIDE 62.5 MCG/ACT IN AEPB
1.0000 | INHALATION_SPRAY | Freq: Every day | RESPIRATORY_TRACT | Status: DC
  Filled 2023-03-06 (×2): qty 7

## 2023-03-06 MED ORDER — ONDANSETRON HCL 4 MG PO TABS: 4.0000 mg | ORAL_TABLET | Freq: Four times a day (QID) | ORAL | Status: DC | PRN

## 2023-03-06 MED ORDER — ACETAMINOPHEN 325 MG PO TABS: 650.0000 mg | ORAL_TABLET | Freq: Four times a day (QID) | ORAL | Status: DC | PRN

## 2023-03-06 MED ORDER — ONDANSETRON HCL 4 MG/2ML IJ SOLN: 4.0000 mg | Freq: Four times a day (QID) | INTRAMUSCULAR | Status: DC | PRN

## 2023-03-06 MED ORDER — ALBUTEROL SULFATE (2.5 MG/3ML) 0.083% IN NEBU: 2.5000 mg | INHALATION_SOLUTION | RESPIRATORY_TRACT | Status: DC | PRN

## 2023-03-06 MED ORDER — SODIUM CHLORIDE 0.9 % IV SOLN: 10.0000 mL/h | Freq: Once | INTRAVENOUS | Status: DC

## 2023-03-06 MED ORDER — ALBUTEROL SULFATE HFA 108 (90 BASE) MCG/ACT IN AERS: 2.0000 | INHALATION_SPRAY | RESPIRATORY_TRACT | Status: DC | PRN

## 2023-03-06 NOTE — ED Triage Notes (Signed)
Pt from daughters house via ems- c/o central constant CP 6/10 that started yesterday did not improve w/ 1 spray of nitroglycerin by EMS. Pt is AOX4, NAD noted. Pt on 2 liter's [place] for COPD. Per ems: lung sounds clear T-wave inversion, NSR otherwise 130/60

## 2023-03-06 NOTE — Assessment & Plan Note (Signed)
Patient having intermittent chest pain for the past 24 hours with lightheadedness Likely related to symptomatic anemia, hemoglobin 6.6 Troponin 15---> 20 Will monitor for now.  No cardiology workup ordered at this time

## 2023-03-06 NOTE — Assessment & Plan Note (Signed)
Symptomatic anemia History of gastric cancer History of duodenal angiectasia Patient presents with chest pain and lightheadedness and weakness Hemoglobin 6.6, and guaiac positive Continue transfusion of PRBCs to hemoglobin of 8 Patient is hospice GI will not be consulted at this time    Latest Ref Rng & Units 03/06/2023    6:10 PM 01/07/2023    4:48 AM 01/06/2023    9:04 PM  CBC  WBC 4.0 - 10.5 K/uL 4.7  5.8  6.0   Hemoglobin 12.0 - 15.0 g/dL 6.6  7.7  7.5   Hematocrit 36.0 - 46.0 % 22.0  25.9  25.4   Platelets 150 - 400 K/uL 187  174  170

## 2023-03-06 NOTE — Assessment & Plan Note (Signed)
Return to home hospice at discharge

## 2023-03-06 NOTE — Assessment & Plan Note (Signed)
Not acutely exacerbated ?Continue home inhalers with DuoNebs as needed ?

## 2023-03-06 NOTE — H&P (Signed)
History and Physical    Patient: Jasmine [name] YNW:[id number] DOB: 08/29/1940 DOA: 03/06/2023 DOS: the patient was seen and examined on 03/06/2023 PCP: Karn Cassis, MD  Patient coming from: Home  Chief Complaint:  Chief Complaint  Patient presents with   Chest Pain    HPI: Jasmine [name] is a 82 y.o. 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  She denied blood in the stool or black stool and has had no vomiting.  Denies abdominal pain.  Denies chest pain cough, fever or chills ED course and data review: Vitals within normal limits Labs notable for hemoglobin of 6.6 down from 7.7 at discharge a month prior.  Troponin 15--> 20 Rectal exam in the ED revealed dark stool guaiac positive. EKG, personally reviewed and interpreted with sinus rhythm at 77 with no acute ST-T wave changes Chest x-ray showing potential left lower lobe pneumonia PRBC ordered. Hospitalist consulted for admission.     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, senile    Chronic kidney disease    COPD (chronic obstructive pulmonary disease) (HCC)    De Quervain's tenosynovitis, bilateral    History of colon polyps 01/20/2018   Nephrolithiasis    Pancreatitis    Spinal stenosis    Stomach cancer (HCC) 1989   Past Surgical History:  Procedure Laterality Date   ABDOMINAL HYSTERECTOMY     billroth 1 hemigastrectomy     BLADDER SURGERY     COLONOSCOPY WITH PROPOFOL N/A 02/20/2015   Procedure: COLONOSCOPY WITH PROPOFOL;  Surgeon: Scot Jun, MD;  Location: [facility]  ENDOSCOPY;  Service: Endoscopy;  Laterality: N/A;   COLONOSCOPY WITH PROPOFOL N/A 03/21/2018   Procedure: COLONOSCOPY WITH PROPOFOL;  Surgeon: Scot Jun, MD;  Location: [facility] ENDOSCOPY;  Service: Endoscopy;  Laterality: N/A;   ESOPHAGOGASTRODUODENOSCOPY  02/20/2015   Procedure: ESOPHAGOGASTRODUODENOSCOPY (EGD);  Surgeon: Scot Jun, MD;  Location: [facility] ENDOSCOPY;  Service: Endoscopy;;   ESOPHAGOGASTRODUODENOSCOPY (EGD) WITH PROPOFOL N/A 03/21/2018   Procedure: ESOPHAGOGASTRODUODENOSCOPY (EGD) WITH PROPOFOL;  Surgeon: Scot Jun, MD;  Location: [facility] ENDOSCOPY;  Service: Endoscopy;  Laterality: N/A;   kidney stone removed     lt.knee surgery     SHOULDER ACROMIOPLASTY     WRIST SURGERY     Social History:  reports that she has been smoking cigarettes. She has a 30 pack-year smoking history. She has never used smokeless tobacco. She reports that she does not drink alcohol and does not use drugs.  Allergies  Allergen Reactions   Iohexol      Code: HIVES, Desc: IVP DYE     History reviewed. No pertinent family history.  Prior to Admission medications   Medication Sig Start Date End Date Taking? Authorizing Provider  albuterol (PROVENTIL HFA;VENTOLIN HFA) 108 (90 BASE) MCG/ACT inhaler Inhale 2 puffs into the lungs every 4 (four) hours as needed for wheezing or shortness of breath.    [provider]  bisoprolol-hydrochlorothiazide (ZIAC) 10-6.25 MG tablet Take 0.5 tablets by mouth daily. 09/02/22   [provider]  cyanocobalamin (VITAMIN B12) 1000 MCG  tablet Take 1,000 mcg by mouth daily.    [provider]  ferrous sulfate 325 (65 FE) MG tablet Take 325 mg by mouth daily with breakfast.    [provider]  fluticasone (FLONASE) 50 MCG/ACT nasal spray Place 2 sprays into both nostrils daily. 07/13/22   [provider]  senna (SENOKOT) 8.6 MG tablet Take 2 tablets (17.2 mg total) by mouth 2 (two) times daily. May crush, mix with water  and give sublingually if needed. 01/08/23   Delfino Lovett, MD  STIOLTO RESPIMAT 2.5-2.5 MCG/ACT AERS Inhale 2 each into the lungs daily. 12/23/22   [provider]    Physical Exam: Vitals:   03/06/23 1804 03/06/23 2230  BP: 109/60 124/60  Pulse: 99 (!) 58  Resp: 19 15  Temp: 98.4 F (36.9 C) 98.2 F (36.8 C)  TempSrc: Oral   SpO2: 90% 100%   Physical Exam Vitals and nursing note reviewed.  Constitutional:      General: She is sleeping. She is not in acute distress.    Comments: Frail-appearing elderly female  HENT:     Head: Normocephalic and atraumatic.  Cardiovascular:     Rate and Rhythm: Normal rate and regular rhythm.     Heart sounds: Normal heart sounds.  Pulmonary:     Effort: Pulmonary effort is normal.     Breath sounds: Normal breath sounds.  Abdominal:     Palpations: Abdomen is soft.     Tenderness: There is no abdominal tenderness.  Neurological:     Mental Status: She is easily aroused. Mental status is at baseline.     Labs on Admission: I have personally reviewed following labs and imaging studies  CBC: Recent Labs  Lab 03/06/23 1810  WBC 4.7  HGB 6.6*  HCT 22.0*  MCV 94.0  PLT 187   Basic Metabolic Panel: Recent Labs  Lab 03/06/23 1810  NA 137  K 3.8  CL 110  CO2 20*  GLUCOSE 93  BUN 25*  CREATININE 0.99  CALCIUM 8.2*   GFR: CrCl cannot be calculated (Unknown ideal weight.). Liver Function Tests: No results for input(s): "AST", "ALT", "ALKPHOS", "BILITOT", "PROT", "ALBUMIN" in the last 168 hours. No results for input(s): "LIPASE", "AMYLASE" in the last 168 hours. No results for input(s): "AMMONIA" in the last 168 hours. Coagulation Profile: No results for input(s): "INR", "PROTIME" in the last 168 hours. Cardiac Enzymes: No results for input(s): "CKTOTAL", "CKMB", "CKMBINDEX", "TROPONINI" in the last 168 hours. BNP (last 3 results) No results for input(s): "PROBNP" in the last 8760 hours. HbA1C: No results for input(s):  "HGBA1C" in the last 72 hours. CBG: No results for input(s): "GLUCAP" in the last 168 hours. Lipid Profile: No results for input(s): "CHOL", "HDL", "LDLCALC", "TRIG", "CHOLHDL", "LDLDIRECT" in the last 72 hours. Thyroid Function Tests: No results for input(s): "TSH", "T4TOTAL", "FREET4", "T3FREE", "THYROIDAB" in the last 72 hours. Anemia Panel: No results for input(s): "VITAMINB12", "FOLATE", "FERRITIN", "TIBC", "IRON", "RETICCTPCT" in the last 72 hours. Urine analysis:    Component Value Date/Time   COLORURINE YELLOW (A) 01/06/2023 2244   APPEARANCEUR CLEAR (A) 01/06/2023 2244   APPEARANCEUR Cloudy 04/10/2012 1442   LABSPEC 1.013 01/06/2023 2244   LABSPEC 1.025 04/10/2012 1442   PHURINE 6.0 01/06/2023 2244   GLUCOSEU NEGATIVE 01/06/2023 2244   GLUCOSEU Negative 04/10/2012 1442   HGBUR NEGATIVE 01/06/2023 2244   BILIRUBINUR NEGATIVE 01/06/2023 2244   BILIRUBINUR Negative 04/10/2012 1442   KETONESUR 5 (A) 01/06/2023 2244   PROTEINUR  NEGATIVE 01/06/2023 2244   NITRITE NEGATIVE 01/06/2023 2244   LEUKOCYTESUR NEGATIVE 01/06/2023 2244   LEUKOCYTESUR Negative 04/10/2012 1442    Radiological Exams on Admission: DG Chest 2 View  Result Date: 03/06/2023 CLINICAL DATA:  Chest pain EXAM: CHEST - 2 VIEW COMPARISON:  Radiograph 11/08/2016 FINDINGS: Normal cardiac silhouette. Lungs are hyperinflated. Mild airspace disease noted over the posterior lung base on lateral projection. Hyperinflated lungs. No pneumothorax. IMPRESSION: 1. potential lower lobe pneumonia. 2. No pneumothorax or pulmonary edema. 3. Hyperinflated lungs. Electronically Signed   By: Genevive Bi M.D.   On: 03/06/2023 19:57     Data Reviewed: Relevant notes from primary care and specialist visits, past discharge summaries as available in EHR, including Care Everywhere. 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 Discussed with daughter Elease Hashimoto at bedside who would like patient to be evaluated by GI with endoscopy if needed.   GI consulted for endoscopy     Latest Ref Rng & Units 03/06/2023    6:10 PM 01/07/2023    4:48 AM 01/06/2023    9:04 PM  CBC  WBC 4.0 - 10.5 K/uL 4.7  5.8  6.0   Hemoglobin 12.0 - 15.0 g/dL 6.6  7.7  7.5   Hematocrit 36.0 - 46.0 % 22.0  25.9  25.4   Platelets 150 - 400 K/uL 187  174  170      Chest pain Patient having intermittent chest pain and lightheadedness likely related to symptomatic anemia, hemoglobin 6.6 Troponin 15---> 20 Daughter Elease Hashimoto at bedside desires cardiology evaluation Will get echocardiogram to evaluate for wall motion abnormality Cardiology consulted  COPD (chronic obstructive pulmonary disease) (HCC) Not acutely exacerbated Continue home inhalers with DuoNebs as needed  Protein-calorie malnutrition, severe Nutritionist consult  Hospice care Return to home hospice at discharge   DVT prophylaxis: SCDs due to ongoing bleeding  Consults: GI Dr. Timothy Lasso and cardiology Dr. Welton Flakes  Advance Care Planning:   Code Status: Prior   Family Communication: Daughter is at bedside  Disposition Plan: Back to previous home environment  Severity of Illness: The appropriate patient status for this patient is OBSERVATION. Observation status is judged to be reasonable and necessary in order to provide the required intensity of service to ensure the patient's safety.  The patient's presenting symptoms, physical exam findings, and initial radiographic and laboratory data in the context of their medical condition is felt to place them at decreased risk for further clinical deterioration. Furthermore, it is anticipated that the patient will be medically stable for discharge from the hospital within 2 midnights of admission.   Author: Andris Baumann, MD 03/06/2023 11:19 PM  For on call review [email or web address].

## 2023-03-06 NOTE — Assessment & Plan Note (Signed)
Nutritionist consult. 

## 2023-03-06 NOTE — ED Provider Notes (Signed)
[facility] Provider Note    Event Date/Time   First MD Initiated Contact with Patient 03/06/23 2214     (approximate)  History   Chief Complaint: Chest Pain  HPI  Jasmine [name] is a 82 y.o. female with a past medical history of anemia, COPD, spinal stenosis, presents to the emergency department for chest pain and weakness.  Daughter states over the past week or 2 the patient has been complaining of intermittent dizziness and weakness.  Over the last 2 days she is now complaining of chest pain seems to be worse with exertion.  Patient wears 2 L chronically for COPD.  Patient denies any chest pain currently.  Physical Exam   Triage Vital Signs: ED Triage Vitals  Encounter Vitals Group     BP 03/06/23 1804 109/60     Systolic BP Percentile --      Diastolic BP Percentile --      Pulse Rate 03/06/23 1804 99     Resp 03/06/23 1804 19     Temp 03/06/23 1804 98.4 F (36.9 C)     Temp Source 03/06/23 1804 Oral     SpO2 03/06/23 1804 90 %     Weight --      Height --      Head Circumference --      Peak Flow --      Pain Score 03/06/23 1807 6     Pain Loc --      Pain Education --      Exclude from Growth Chart --     Most recent vital signs: Vitals:   03/06/23 1804 03/06/23 2230  BP: 109/60 124/60  Pulse: 99 (!) 58  Resp: 19 15  Temp: 98.4 F (36.9 C) 98.2 F (36.8 C)  SpO2: 90% 100%    General: Awake, no distress.  CV:  Good peripheral perfusion.  Regular rate and rhythm  Resp:  Normal effort.  Equal breath sounds bilaterally.  Abd:  No distention.  Soft, nontender.  No rebound or guarding. Other:  Rectal examination shows slightly dark stool guaiac positive.   ED Results / Procedures / Treatments   EKG  EKG viewed and interpreted by myself shows sinus bradycardia 58 bpm with a narrow QRS, normal axis, normal intervals, no concerning ST changes.  RADIOLOGY  Chest x-ray viewed and interpreted by myself shows no concerning  findings no consolidation. Radiology is read the x-ray as a potential lower lobe pneumonia.  Patient denies any cough or shortness of breath.   MEDICATIONS ORDERED IN ED: Medications  pantoprazole (PROTONIX) 80 mg /NS 100 mL IVPB (has no administration in time range)  pantoprozole (PROTONIX) 80 mg /NS 100 mL infusion (has no administration in time range)  0.9 %  sodium chloride infusion (has no administration in time range)     IMPRESSION / MDM / ASSESSMENT AND PLAN / ED COURSE  I reviewed the triage vital signs and the nursing notes.  Patient's presentation is most consistent with acute presentation with potential threat to life or bodily function.  Patient presents to the emergency department for 1 to 2 weeks of intermittent dizziness and now 2 days of intermittent chest pain.  Patient's workup today shows initial troponin of 15 slight elevation to 20 although not significant.  Patient's chemistry shows no significant findings.  Patient CBC however shows significant hemoglobin of 6.6 likely indicating symptomatic anemia.  Rectal examination shows darker appearing stool although not melena but guaiac positive.  Will start the patient on Protonix bolus and infusion.  Given the patient's symptomatic anemia with a hemoglobin of 6.6 I have ordered 1 unit of packed red blood cells for transfusion and I have consented the patient's daughter and the patient they are agreeable.  Will admit to the hospital service for further workup and treatment.  CRITICAL CARE Performed by: Minna Antis   Total critical care time: 30 minutes  Critical care time was exclusive of separately billable procedures and treating other patients.  Critical care was necessary to treat or prevent imminent or life-threatening deterioration.  Critical care was time spent personally by me on the following activities: development of treatment plan with patient and/or surrogate as well as nursing, discussions with  consultants, evaluation of patient's response to treatment, examination of patient, obtaining history from patient or surrogate, ordering and performing treatments and interventions, ordering and review of laboratory studies, ordering and review of radiographic studies, pulse oximetry and re-evaluation of patient's condition.   FINAL CLINICAL IMPRESSION(S) / ED DIAGNOSES   Symptomatic anemia Chest pain   Note:  This document was prepared using Dragon voice recognition software and may include unintentional dictation errors.   Minna Antis, MD 03/06/23 [id number]

## 2023-03-07 ENCOUNTER — Observation Stay
Admit: 2023-03-07 | Discharge: 2023-03-07 | Disposition: A | Discharge status: Home / Self Care | Attending: Internal Medicine | Admitting: Internal Medicine

## 2023-03-07 DIAGNOSIS — R079 Chest pain, unspecified: Secondary | ICD-10-CM | POA: Diagnosis not present

## 2023-03-07 DIAGNOSIS — D649 Anemia, unspecified: Secondary | ICD-10-CM

## 2023-03-07 LAB — BASIC METABOLIC PANEL
Anion gap: 4 — ABNORMAL LOW (ref 5–15)
BUN: 24 mg/dL — ABNORMAL HIGH (ref 8–23)
CO2: 23 mmol/L (ref 22–32)
Calcium: 8.6 mg/dL — ABNORMAL LOW (ref 8.9–10.3)
Chloride: 108 mmol/L (ref 98–111)
Creatinine, Ser: 0.97 mg/dL (ref 0.44–1.00)
GFR, Estimated: 58 mL/min — ABNORMAL LOW (ref >60)
Glucose, Bld: 82 mg/dL (ref 70–99)
Potassium: 3.9 mmol/L (ref 3.5–5.1)
Sodium: 135 mmol/L (ref 135–145)

## 2023-03-07 LAB — URINALYSIS, COMPLETE (UACMP) WITH MICROSCOPIC
Bilirubin Urine: NEGATIVE
Glucose, UA: NEGATIVE mg/dL
Hgb urine dipstick: NEGATIVE
Ketones, ur: NEGATIVE mg/dL
Leukocytes,Ua: NEGATIVE
Nitrite: NEGATIVE
Protein, ur: NEGATIVE mg/dL
RBC / HPF: NONE SEEN RBC/hpf (ref 0–5)
Specific Gravity, Urine: 1.02 (ref 1.005–1.030)
pH: 6 (ref 5.0–8.0)

## 2023-03-07 LAB — CBC
HCT: 25.2 % — ABNORMAL LOW (ref 36.0–46.0)
Hemoglobin: 8.2 g/dL — ABNORMAL LOW (ref 12.0–15.0)
MCH: 28.8 pg (ref 26.0–34.0)
MCHC: 32.5 g/dL (ref 30.0–36.0)
MCV: 88.4 fL (ref 80.0–100.0)
Platelets: 177 10*3/uL (ref 150–400)
RBC: 2.85 MIL/uL — ABNORMAL LOW (ref 3.87–5.11)
RDW: 17.2 % — ABNORMAL HIGH (ref 11.5–15.5)
WBC: 4.5 10*3/uL (ref 4.0–10.5)
nRBC: 0 % (ref 0.0–0.2)

## 2023-03-07 LAB — PREPARE RBC (CROSSMATCH)

## 2023-03-07 MED ORDER — LORAZEPAM 0.5 MG PO TABS
0.5000 mg | ORAL_TABLET | Freq: Every evening | ORAL | Status: DC | PRN
  Administered 2023-03-07: 0.5 mg via ORAL
  Filled 2023-03-07: qty 1

## 2023-03-07 MED ORDER — SODIUM CHLORIDE 0.9 % IV SOLN: INTRAVENOUS | Status: AC

## 2023-03-07 MED ORDER — ISOSORBIDE MONONITRATE ER 30 MG PO TB24
15.0000 mg | ORAL_TABLET | Freq: Every day | ORAL | Status: DC
  Filled 2023-03-07: qty 1

## 2023-03-07 MED ORDER — FERROUS SULFATE 325 (65 FE) MG PO TABS: 325.0000 mg | ORAL_TABLET | Freq: Every day | ORAL | Status: DC

## 2023-03-07 MED ORDER — PANTOPRAZOLE SODIUM 40 MG PO TBEC: 40.0000 mg | DELAYED_RELEASE_TABLET | Freq: Two times a day (BID) | ORAL | 0 refills | Status: AC

## 2023-03-07 MED ORDER — ISOSORBIDE MONONITRATE ER 30 MG PO TB24: 15.0000 mg | ORAL_TABLET | Freq: Every day | ORAL | 1 refills | Status: DC

## 2023-03-07 MED ORDER — PANTOPRAZOLE SODIUM 40 MG IV SOLR: 40.0000 mg | Freq: Two times a day (BID) | INTRAVENOUS | Status: DC

## 2023-03-07 MED ORDER — MELATONIN 5 MG PO TABS
2.5000 mg | ORAL_TABLET | Freq: Every day | ORAL | Status: DC
  Administered 2023-03-07: 2.5 mg via ORAL

## 2023-03-07 NOTE — ED Notes (Signed)
Delay in giving medications due to the medications coming from main pharmacy.

## 2023-03-07 NOTE — ED Notes (Signed)
Unable to obtain VS due to pt refusing at this time

## 2023-03-07 NOTE — ED Notes (Signed)
 Went over d/c paperwork at this time with patient and daughter. Pt/daughter had no questions, comments or concerns after review and verbally understood them.

## 2023-03-07 NOTE — Progress Notes (Signed)
  Echocardiogram 2D Echocardiogram has been performed.  Roben Tatsch C Dessie Tatem 03/07/2023, 1:32 PM

## 2023-03-07 NOTE — Consult Note (Signed)
Jasmine [name] is a 82 y.o. female  [id number]  Primary Cardiologist: [facility] cardiology Reason for Consultation: Chest pain  HPI: This is a 82 year old African-American female who presented to the hospital with intermittent chest pain.  Patient has a history of COPD chronic kidney disease anemia and is currently on under hospice but I was asked to evaluate the patient.  Patient appears to be comfortable but is not very cooperative.   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, senile    Chronic kidney disease    COPD (chronic obstructive pulmonary disease) (HCC)    De Quervain's tenosynovitis, bilateral    History of colon polyps 01/20/2018   Nephrolithiasis    Pancreatitis    Spinal stenosis    Stomach cancer (HCC) 1989    (Not in a hospital admission)     arformoterol  15 mcg Nebulization BID   And   umeclidinium bromide  1 puff Inhalation Daily   ferrous sulfate  325 mg Oral Q breakfast   melatonin  2.5 mg Oral QHS   pantoprazole (PROTONIX) IV  40 mg Intravenous Q12H    Infusions:  sodium chloride Stopped (03/07/23 0023)    Allergies  Allergen Reactions   Iodinated Contrast Media Hives    Code: HIVES, Desc: IVP DYE   Iohexol      Code: HIVES, Desc: IVP DYE    Seroquel [Quetiapine] Other (See Comments)    Hallucinations    Social History   Socioeconomic History   Marital status: Single    Spouse name: Not on file   Number of children: Not on file   Years of education: Not on file   Highest education level: Not on file  Occupational History   Not on file  Tobacco Use   Smoking status: Every Day    Current packs/day: 0.50    Average packs/day: 0.5 packs/day for 60.0 years (30.0 ttl pk-yrs)    Types: Cigarettes   Smokeless tobacco: Never  Substance and Sexual Activity   Alcohol use: No   Drug use: No   Sexual activity: Not on file  Other  Topics Concern   Not on file  Social History Narrative   Not on file   Social Determinants of Health   Financial Resource Strain: Low Risk  (12/23/2022)   Received from [facility]   Overall Financial Resource Strain (CARDIA)    Difficulty of Paying Living Expenses: Not very hard  Food Insecurity: No Food Insecurity (01/07/2023)   Hunger Vital Sign    Worried About Running Out of Food in the Last Year: Never true    Ran Out of Food in the Last Year: Never true  Transportation Needs: No Transportation Needs (01/07/2023)   PRAPARE - Administrator, Civil Service (Medical): No    Lack of Transportation (Non-Medical): No  Physical Activity: Not on file  Stress: Not on file  Social Connections: Not on file  Intimate Partner Violence: Not At Risk (01/07/2023)   Humiliation, Afraid, Rape, and Kick questionnaire    Fear of Current or Ex-Partner: No    Emotionally Abused: No    Physically Abused: No    Sexually Abused: No    History reviewed. No pertinent family history.  PHYSICAL EXAM: Vitals:   03/07/23 0430 03/07/23 0700  BP: Marland Kitchen)  142/59 (!) 120/47  Pulse: (!) 50 (!) 54  Resp: 16 16  Temp:    SpO2: 100% 100%    No intake or output data in the 24 hours ending 03/07/23 1142  General:  Well appearing. No respiratory difficulty HEENT: normal Neck: supple. no JVD. Carotids 2+ bilat; no bruits. No lymphadenopathy or thryomegaly appreciated. Cor: PMI nondisplaced. Regular rate & rhythm. No rubs, gallops or murmurs. Lungs: clear Abdomen: soft, nontender, nondistended. No hepatosplenomegaly. No bruits or masses. Good bowel sounds. Extremities: no cyanosis, clubbing, rash, edema Neuro: alert & oriented x 3, cranial nerves grossly intact. moves all 4 extremities w/o difficulty. Affect pleasant.  ECG: Sinus rhythm with some sagging inferolaterally  Results for orders placed or performed during the hospital encounter of 03/06/23  (from the past 24 hour(s))  Basic metabolic panel     Status: Abnormal   Collection Time: 03/06/23  6:10 PM  Result Value Ref Range   Sodium 137 135 - 145 mmol/L   Potassium 3.8 3.5 - 5.1 mmol/L   Chloride 110 98 - 111 mmol/L   CO2 20 (L) 22 - 32 mmol/L   Glucose, Bld 93 70 - 99 mg/dL   BUN 25 (H) 8 - 23 mg/dL   Creatinine, Ser 9.62 0.44 - 1.00 mg/dL   Calcium 8.2 (L) 8.9 - 10.3 mg/dL   GFR, Estimated 57 (L) >60 mL/min   Anion gap 7 5 - 15  CBC     Status: Abnormal   Collection Time: 03/06/23  6:10 PM  Result Value Ref Range   WBC 4.7 4.0 - 10.5 K/uL   RBC 2.34 (L) 3.87 - 5.11 MIL/uL   Hemoglobin 6.6 (L) 12.0 - 15.0 g/dL   HCT 95.2 (L) 84.1 - 32.4 %   MCV 94.0 80.0 - 100.0 fL   MCH 28.2 26.0 - 34.0 pg   MCHC 30.0 30.0 - 36.0 g/dL   RDW 40.1 (H) 02.7 - 25.3 %   Platelets 187 150 - 400 K/uL   nRBC 0.0 0.0 - 0.2 %  Troponin I (High Sensitivity)     Status: None   Collection Time: 03/06/23  6:10 PM  Result Value Ref Range   Troponin I (High Sensitivity) 15 <18 ng/L  ABO/Rh     Status: None   Collection Time: 03/06/23  6:10 PM  Result Value Ref Range   ABO/RH(D)      O POS Performed at [facility], [address] [place], [place] [address]   Troponin I (High Sensitivity)     Status: Abnormal   Collection Time: 03/06/23 10:07 PM  Result Value Ref Range   Troponin I (High Sensitivity) 20 (H) <18 ng/L  Prepare RBC (crossmatch)     Status: None   Collection Time: 03/06/23 10:29 PM  Result Value Ref Range   Order Confirmation      ORDER PROCESSED BY BLOOD BANK Performed at [facility], [address] [place], [place] [address]   Type and screen [facility]     Status: None (Preliminary result)   Collection Time: 03/06/23 11:14 PM  Result Value Ref Range   ABO/RH(D) O POS    Antibody Screen NEG    Sample Expiration 03/09/2023,2359    Unit Number [id number]    Blood Component Type RED CELLS,LR    Unit division 00    Status  of Unit ISSUED    Transfusion Status OK TO TRANSFUSE    Crossmatch Result  Compatible Performed at [facility], [address] [place], [place] [address]   Urinalysis, Complete w Microscopic -Urine, Clean Catch     Status: Abnormal   Collection Time: 03/07/23  3:31 AM  Result Value Ref Range   Color, Urine YELLOW YELLOW   APPearance CLEAR CLEAR   Specific Gravity, Urine 1.020 1.005 - 1.030   pH 6.0 5.0 - 8.0   Glucose, UA NEGATIVE NEGATIVE mg/dL   Hgb urine dipstick NEGATIVE NEGATIVE   Bilirubin Urine NEGATIVE NEGATIVE   Ketones, ur NEGATIVE NEGATIVE mg/dL   Protein, ur NEGATIVE NEGATIVE mg/dL   Nitrite NEGATIVE NEGATIVE   Leukocytes,Ua NEGATIVE NEGATIVE   Squamous Epithelial / HPF 0-5 0 - 5 /HPF   WBC, UA 0-5 0 - 5 WBC/hpf   RBC / HPF NONE SEEN 0 - 5 RBC/hpf   Bacteria, UA RARE (A) NONE SEEN  CBC     Status: Abnormal   Collection Time: 03/07/23  7:01 AM  Result Value Ref Range   WBC 4.5 4.0 - 10.5 K/uL   RBC 2.85 (L) 3.87 - 5.11 MIL/uL   Hemoglobin 8.2 (L) 12.0 - 15.0 g/dL   HCT 60.4 (L) 54.0 - 98.1 %   MCV 88.4 80.0 - 100.0 fL   MCH 28.8 26.0 - 34.0 pg   MCHC 32.5 30.0 - 36.0 g/dL   RDW 19.1 (H) 47.8 - 29.5 %   Platelets 177 150 - 400 K/uL   nRBC 0.0 0.0 - 0.2 %  Basic metabolic panel     Status: Abnormal   Collection Time: 03/07/23  7:01 AM  Result Value Ref Range   Sodium 135 135 - 145 mmol/L   Potassium 3.9 3.5 - 5.1 mmol/L   Chloride 108 98 - 111 mmol/L   CO2 23 22 - 32 mmol/L   Glucose, Bld 82 70 - 99 mg/dL   BUN 24 (H) 8 - 23 mg/dL   Creatinine, Ser 6.21 0.44 - 1.00 mg/dL   Calcium 8.6 (L) 8.9 - 10.3 mg/dL   GFR, Estimated 58 (L) >60 mL/min   Anion gap 4 (L) 5 - 15   DG Chest 2 View  Result Date: 03/06/2023 CLINICAL DATA:  Chest pain EXAM: CHEST - 2 VIEW COMPARISON:  Radiograph 11/08/2016 FINDINGS: Normal cardiac silhouette. Lungs are hyperinflated. Mild airspace disease noted over the posterior lung base on lateral projection.  Hyperinflated lungs. No pneumothorax. IMPRESSION: 1. potential lower lobe pneumonia. 2. No pneumothorax or pulmonary edema. 3. Hyperinflated lungs. Electronically Signed   By: Genevive Bi M.D.   On: 03/06/2023 19:57     ASSESSMENT AND PLAN: Atypical chest pain with troponin only 20 and no acute ST changes on EKG.  Advised conservative medical therapy.  Patient is very uncooperative and confused not willing to be even examined.  Dessie Delcarlo Welton Flakes

## 2023-03-07 NOTE — Discharge Summary (Signed)
Physician Discharge Summary  Jasmine [name] WGN:[id number] DOB: 1940-10-23 DOA: 03/06/2023  PCP: Karn Cassis, MD  Admit date: 03/06/2023 Discharge date: 03/07/2023  Admitted From: Home with hospice  Disposition: Home with hospice  Discharge Condition: Stable CODE STATUS: DNR Diet recommendation: Heart healthy diet  Brief/Interim Summary: From H&P by Dr. Para March: "Jasmine [name] is a 82 y.o. 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  She denied blood in the stool or black stool and has had no vomiting.  Denies abdominal pain.  Denies chest pain cough, fever or chills ED course and data review: Vitals within normal limits Labs notable for hemoglobin of 6.6 down from 7.7 at discharge a month prior.  Troponin 15--> 20 Rectal exam in the ED revealed dark stool guaiac positive. EKG, personally reviewed and interpreted with sinus rhythm at 77 with no acute ST-T wave changes Chest x-ray showing potential left lower lobe pneumonia PRBC ordered. Hospitalist consulted for admission."  Patient was seen by GI as well as cardiology for consultation regarding symptomatic anemia and chest pain.  After GI's conversation with patient's daughter, given her comorbidities, hospice status, patient declining procedures, they had planned for conservative management.  She did get 1 unit packed red blood cell transfusion.  Recommended twice daily PPI for 8 weeks and to advance diet.  Cardiology recommended medical management, no further procedures.  Discharge Diagnoses:   Principal Problem:   Symptomatic anemia Active Problems:   Anemia  due to GI chronic blood loss   History of gastric cancer   Chest pain   COPD (chronic obstructive pulmonary disease) (HCC)   Hospice care   Protein-calorie malnutrition, severe   Iron deficiency anemia due to chronic blood loss   Discharge Instructions  Discharge Instructions     Call MD for:  difficulty breathing, headache or visual disturbances   Complete by: As directed    Call MD for:  extreme fatigue   Complete by: As directed    Call MD for:  persistant dizziness or light-headedness   Complete by: As directed    Call MD for:  persistant nausea and vomiting   Complete by: As directed    Call MD for:  severe uncontrolled pain   Complete by: As directed    Call MD for:  temperature >100.4   Complete by: As directed    Diet - low sodium heart healthy   Complete by: As directed    Discharge instructions   Complete by: As directed    You were cared for by a hospitalist during your hospital stay. If you have any questions about your discharge medications or the care you received while you were in the hospital after you are discharged, you can call the unit and ask to speak with the hospitalist on call if the hospitalist that took care of you is not available. Once you are discharged, your primary care physician will handle any further medical issues. Please note that NO REFILLS for any discharge medications will be authorized once you are discharged, as it is imperative that you return to your primary care physician (or establish a relationship with a primary care physician if you  do not have one) for your aftercare needs so that they can reassess your need for medications and monitor your lab values.   Increase activity slowly   Complete by: As directed       Allergies as of 03/07/2023       Reactions   Iodinated Contrast Media Hives   Code: HIVES, Desc: IVP DYE   Iohexol     Code: HIVES, Desc: IVP DYE   Seroquel [quetiapine] Other (See Comments)   Hallucinations         Medication List     STOP taking these medications    QUEtiapine 25 MG tablet Commonly known as: SEROQUEL       TAKE these medications    acetaminophen 650 MG CR tablet Commonly known as: TYLENOL Take 1,300 mg by mouth daily as needed for pain.   albuterol 108 (90 Base) MCG/ACT inhaler Commonly known as: VENTOLIN HFA Inhale 2 puffs into the lungs every 4 (four) hours as needed for wheezing or shortness of breath.   bisoprolol-hydrochlorothiazide 10-6.25 MG tablet Commonly known as: ZIAC Take 0.5 tablets by mouth daily.   [employer] Gentle Laxative 10 MG suppository Generic drug: bisacodyl Place 10 mg rectally 2 (two) times daily as needed (for no bowel movement.).   cyanocobalamin 1000 MCG tablet Commonly known as: VITAMIN B12 Take 1,000 mcg by mouth daily.   ferrous sulfate 325 (65 FE) MG tablet Take 325 mg by mouth daily with breakfast.   fluticasone 50 MCG/ACT nasal spray Commonly known as: FLONASE Place 2 sprays into both nostrils daily as needed for allergies.   isosorbide mononitrate 30 MG 24 hr tablet Commonly known as: IMDUR Take 0.5 tablets (15 mg total) by mouth daily.   LORazepam 0.5 MG tablet Commonly known as: ATIVAN Take 0.5 mg by mouth every 4 (four) hours as needed.   morphine PF 0.5 mg/mL Soln Commonly known as: ASTRAMORPH Take 0.25 mg by mouth every 6 (six) hours as needed.   pantoprazole 40 MG tablet Commonly known as: Protonix Take 1 tablet (40 mg total) by mouth 2 (two) times daily before a meal.   senna 8.6 MG tablet Commonly known as: Senokot Take 2 tablets (17.2 mg total) by mouth 2 (two) times daily. May crush, mix with water and give sublingually if needed.   Stiolto Respimat 2.5-2.5 MCG/ACT Aers Generic drug: Tiotropium Bromide-Olodaterol Inhale 2 each into the lungs daily.        Follow-up Information     Karn Cassis, MD Follow up.   Specialty: Hospice and Palliative Medicine Contact information: [address]  [place] [address] [phone]                Allergies  Allergen Reactions   Iodinated Contrast Media Hives    Code: HIVES, Desc: IVP DYE   Iohexol      Code: HIVES, Desc: IVP DYE    Seroquel [Quetiapine] Other (See Comments)    Hallucinations    Consultations: GI Cardiology   Procedures/Studies: DG Chest 2 View  Result Date: 03/06/2023 CLINICAL DATA:  Chest pain EXAM: CHEST - 2 VIEW COMPARISON:  Radiograph 11/08/2016 FINDINGS: Normal cardiac silhouette. Lungs are hyperinflated. Mild airspace disease noted over the posterior lung base on lateral projection. Hyperinflated lungs. No pneumothorax. IMPRESSION: 1. potential lower lobe pneumonia. 2. No pneumothorax or pulmonary edema. 3. Hyperinflated lungs. Electronically Signed   By: Genevive Bi M.D.   On: 03/06/2023 19:57     Discharge Exam: Vitals:  03/07/23 0430 03/07/23 0700  BP: (!) 142/59 (!) 120/47  Pulse: (!) 50 (!) 54  Resp: 16 16  Temp:    SpO2: 100% 100%    General: Pt is alert, awake, not in acute distress Cardiovascular: RRR, S1/S2 +, no edema Respiratory: CTA bilaterally, no wheezing, no rhonchi, no respiratory distress, no conversational dyspnea  Abdominal: Soft, NT, ND, bowel sounds + Extremities: no edema, no cyanosis Psych: Largely uncooperative     The results of significant diagnostics from this hospitalization (including imaging, microbiology, ancillary and laboratory) are listed below for reference.     Microbiology: No results found for this or any previous visit (from the past 240 hour(s)).   Labs: BNP (last 3 results) No results for input(s): "BNP" in the last 8760 hours. Basic Metabolic Panel: Recent Labs  Lab 03/06/23 1810 03/07/23 0701  NA 137 135  K 3.8 3.9  CL 110 108  CO2 20* 23  GLUCOSE 93 82  BUN 25* 24*  CREATININE 0.99 0.97  CALCIUM 8.2* 8.6*   Liver Function Tests: No results for input(s): "AST", "ALT", "ALKPHOS", "BILITOT", "PROT", "ALBUMIN" in  the last 168 hours. No results for input(s): "LIPASE", "AMYLASE" in the last 168 hours. No results for input(s): "AMMONIA" in the last 168 hours. CBC: Recent Labs  Lab 03/06/23 1810 03/07/23 0701  WBC 4.7 4.5  HGB 6.6* 8.2*  HCT 22.0* 25.2*  MCV 94.0 88.4  PLT 187 177   Cardiac Enzymes: No results for input(s): "CKTOTAL", "CKMB", "CKMBINDEX", "TROPONINI" in the last 168 hours. BNP: Invalid input(s): "POCBNP" CBG: No results for input(s): "GLUCAP" in the last 168 hours. D-Dimer No results for input(s): "DDIMER" in the last 72 hours. Hgb A1c No results for input(s): "HGBA1C" in the last 72 hours. Lipid Profile No results for input(s): "CHOL", "HDL", "LDLCALC", "TRIG", "CHOLHDL", "LDLDIRECT" in the last 72 hours. Thyroid function studies No results for input(s): "TSH", "T4TOTAL", "T3FREE", "THYROIDAB" in the last 72 hours.  Invalid input(s): "FREET3" Anemia work up No results for input(s): "VITAMINB12", "FOLATE", "FERRITIN", "TIBC", "IRON", "RETICCTPCT" in the last 72 hours. Urinalysis    Component Value Date/Time   COLORURINE YELLOW 03/07/2023 0331   APPEARANCEUR CLEAR 03/07/2023 0331   APPEARANCEUR Cloudy 04/10/2012 1442   LABSPEC 1.020 03/07/2023 0331   LABSPEC 1.025 04/10/2012 1442   PHURINE 6.0 03/07/2023 0331   GLUCOSEU NEGATIVE 03/07/2023 0331   GLUCOSEU Negative 04/10/2012 1442   HGBUR NEGATIVE 03/07/2023 0331   BILIRUBINUR NEGATIVE 03/07/2023 0331   BILIRUBINUR Negative 04/10/2012 1442   KETONESUR NEGATIVE 03/07/2023 0331   PROTEINUR NEGATIVE 03/07/2023 0331   NITRITE NEGATIVE 03/07/2023 0331   LEUKOCYTESUR NEGATIVE 03/07/2023 0331   LEUKOCYTESUR Negative 04/10/2012 1442   Sepsis Labs Recent Labs  Lab 03/06/23 1810 03/07/23 0701  WBC 4.7 4.5   Microbiology No results found for this or any previous visit (from the past 240 hour(s)).   Patient was seen and examined on the day of discharge and was found to be in stable condition. Time coordinating  discharge: 40 minutes including assessment and coordination of care, as well as examination of the patient.   SIGNED:  Noralee Stain, DO [facility] 03/07/2023, 12:25 PM

## 2023-03-07 NOTE — Consult Note (Signed)
Inpatient Consultation   Patient ID: Jasmine [name] is a 82 y.o. female.  Requesting Provider: Lindajo Royal, MD  Date of Admission: 03/06/2023  Date of Consult: 03/07/23   Reason for Consultation: anemia   Patient's Chief Complaint:   Chief Complaint  Patient presents with   Chest Pain    82 year old African-American female with COPD (home O2 dependent, history of gastric cancer status post Billroth I, L5 compression fracture, question of ovarian mass on CT, history of bladder cancer and dementia per daughter report who presents to the hospital with chest pain and shortness of breath.  Gastroenterology is consulted for anemia.  Due to her dementia and the patient is not able to provide meaningful information.  I did call the daughter who help provide history and informed me of the dementia which was not initially listed in the H&P.  Upon entering the room the patient was wearing her nasal cannula on her forehead when I asked and attempted to fix this, the patient refused and swatted me away.  She is unable to tell me why she is in the hospital.  She denies any pain in her belly. Pt states she does not want any procedures (I asked a few different times and described what EGD was)  Was recently hospitalized in July and found to have an L5 compression fracture.  She was examined by neurosurgery who placed her in a brace.  She was transition to hospice care, Jasmine [name] presents to the hospital today with the above symptoms.  She was noted to have a hemoglobin of 6.6 on presentation.  It was 7.7 on July 4.  She has now received 1 unit PRBC in the hospital.  This is now improved to 8.2.  No GI bleeding symptoms per daughter.  There is no melena on emergency physicians rectal exam. BUN 25 creatinine 0.99.  Per the daughter due to the back pain the patient had been taking Advil at home for several weeks.  Denies Anti-plt agents, and anticoagulants Denies family history of gastrointestinal  disease and malignancy Previous Endoscopies: EGD and Colonoscopy in 2019-  EGD demonstrating surgically altered anatomy with duodenal non bleeding avms, otherwise unremarkable Colonoscopy with multiple adenomatous polyps.   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, senile    Chronic kidney disease    COPD (chronic obstructive pulmonary disease) (HCC)    De Quervain's tenosynovitis, bilateral    History of colon polyps 01/20/2018   Nephrolithiasis    Pancreatitis    Spinal stenosis    Stomach cancer (HCC) 1989    Past Surgical History:  Procedure Laterality Date   ABDOMINAL HYSTERECTOMY     billroth 1 hemigastrectomy     BLADDER SURGERY     COLONOSCOPY WITH PROPOFOL N/A 02/20/2015   Procedure: COLONOSCOPY WITH PROPOFOL;  Surgeon: Scot Jun, MD;  Location: [facility] ENDOSCOPY;  Service: Endoscopy;  Laterality: N/A;   COLONOSCOPY WITH PROPOFOL N/A 03/21/2018   Procedure: COLONOSCOPY WITH PROPOFOL;  Surgeon: Scot Jun, MD;  Location: [facility] ENDOSCOPY;  Service: Endoscopy;  Laterality: N/A;   ESOPHAGOGASTRODUODENOSCOPY  02/20/2015   Procedure: ESOPHAGOGASTRODUODENOSCOPY (EGD);  Surgeon: Scot Jun, MD;  Location: [facility] ENDOSCOPY;  Service: Endoscopy;;   ESOPHAGOGASTRODUODENOSCOPY (EGD) WITH PROPOFOL N/A 03/21/2018   Procedure: ESOPHAGOGASTRODUODENOSCOPY (EGD) WITH PROPOFOL;  Surgeon: Scot Jun, MD;  Location: [facility] ENDOSCOPY;  Service: Endoscopy;  Laterality: N/A;   kidney stone  removed     lt.knee surgery     SHOULDER ACROMIOPLASTY     WRIST SURGERY      Allergies  Allergen Reactions   Iodinated Contrast Media Hives    Code: HIVES, Desc: IVP DYE   Iohexol      Code: HIVES, Desc: IVP DYE    Seroquel [Quetiapine] Other (See Comments)    Hallucinations    History reviewed. No pertinent family history.  Social History   Tobacco Use   Smoking status: Every Day    Current packs/day: 0.50     Average packs/day: 0.5 packs/day for 60.0 years (30.0 ttl pk-yrs)    Types: Cigarettes   Smokeless tobacco: Never  Substance Use Topics   Alcohol use: No   Drug use: No     Pertinent GI related history and allergies were reviewed with the patient  Review of Systems  Unable to perform ROS: Dementia     Medications Home Medications No current facility-administered medications on file prior to encounter.   Current Outpatient Medications on File Prior to Encounter  Medication Sig Dispense Refill   acetaminophen (TYLENOL) 650 MG CR tablet Take 1,300 mg by mouth daily as needed for pain.     albuterol (PROVENTIL HFA;VENTOLIN HFA) 108 (90 BASE) MCG/ACT inhaler Inhale 2 puffs into the lungs every 4 (four) hours as needed for wheezing or shortness of breath.     bisoprolol-hydrochlorothiazide (ZIAC) 10-6.25 MG tablet Take 0.5 tablets by mouth daily.     [employer] GENTLE LAXATIVE 10 MG suppository Place 10 mg rectally 2 (two) times daily as needed (for no bowel movement.).     cyanocobalamin (VITAMIN B12) 1000 MCG tablet Take 1,000 mcg by mouth daily.     ferrous sulfate 325 (65 FE) MG tablet Take 325 mg by mouth daily with breakfast.     fluticasone (FLONASE) 50 MCG/ACT nasal spray Place 2 sprays into both nostrils daily as needed for allergies.     LORazepam (ATIVAN) 0.5 MG tablet Take 0.5 mg by mouth every 4 (four) hours as needed.     morphine PF (ASTRAMORPH) 0.5 mg/mL SOLN Take 0.25 mg by mouth every 6 (six) hours as needed.     QUEtiapine (SEROQUEL) 25 MG tablet Take 25 mg by mouth at bedtime.     senna (SENOKOT) 8.6 MG tablet Take 2 tablets (17.2 mg total) by mouth 2 (two) times daily. May crush, mix with water and give sublingually if needed. 28 tablet 0   STIOLTO RESPIMAT 2.5-2.5 MCG/ACT AERS Inhale 2 each into the lungs daily.     Pertinent GI related medications were reviewed with the patient  Inpatient Medications  Current Facility-Administered Medications:    0.9 %  sodium chloride  infusion, 10 mL/hr, Intravenous, Once, Minna Antis, MD, Stopped at 03/07/23 0023   acetaminophen (TYLENOL) tablet 650 mg, 650 mg, Oral, Q6H PRN **OR** acetaminophen (TYLENOL) suppository 650 mg, 650 mg, Rectal, Q6H PRN, Andris Baumann, MD   albuterol (PROVENTIL) (2.5 MG/3ML) 0.083% nebulizer solution 2.5 mg, 2.5 mg, Nebulization, Q4H PRN, Belue, Nathan S, RPH   arformoterol (BROVANA) nebulizer solution 15 mcg, 15 mcg, Nebulization, BID **AND** umeclidinium bromide (INCRUSE ELLIPTA) 62.5 MCG/ACT 1 puff, 1 puff, Inhalation, Daily, Lindajo Royal V, MD   ferrous sulfate tablet 325 mg, 325 mg, Oral, Q breakfast, Lindajo Royal V, MD   LORazepam (ATIVAN) tablet 0.5 mg, 0.5 mg, Oral, QHS PRN, Lindajo Royal V, MD, 0.5 mg at 03/07/23 0608   melatonin tablet 2.5 mg, 2.5 mg,  Oral, QHS, Lindajo Royal V, MD, 2.5 mg at 03/07/23 0435   ondansetron (ZOFRAN) tablet 4 mg, 4 mg, Oral, Q6H PRN **OR** ondansetron (ZOFRAN) injection 4 mg, 4 mg, Intravenous, Q6H PRN, Andris Baumann, MD   [START ON 03/10/2023] pantoprazole (PROTONIX) injection 40 mg, 40 mg, Intravenous, Q24H, Para March, Odetta Pink, MD   pantoprozole (PROTONIX) 80 mg /NS 100 mL infusion, 8 mg/hr, Intravenous, Continuous, Minna Antis, MD, Last Rate: 10 mL/hr at 03/07/23 0824, 8 mg/hr at 03/07/23 1610  Current Outpatient Medications:    acetaminophen (TYLENOL) 650 MG CR tablet, Take 1,300 mg by mouth daily as needed for pain., Disp: , Rfl:    albuterol (PROVENTIL HFA;VENTOLIN HFA) 108 (90 BASE) MCG/ACT inhaler, Inhale 2 puffs into the lungs every 4 (four) hours as needed for wheezing or shortness of breath., Disp: , Rfl:    bisoprolol-hydrochlorothiazide (ZIAC) 10-6.25 MG tablet, Take 0.5 tablets by mouth daily., Disp: , Rfl:    [employer] GENTLE LAXATIVE 10 MG suppository, Place 10 mg rectally 2 (two) times daily as needed (for no bowel movement.)., Disp: , Rfl:    cyanocobalamin (VITAMIN B12) 1000 MCG tablet, Take 1,000 mcg by mouth daily., Disp: , Rfl:     ferrous sulfate 325 (65 FE) MG tablet, Take 325 mg by mouth daily with breakfast., Disp: , Rfl:    fluticasone (FLONASE) 50 MCG/ACT nasal spray, Place 2 sprays into both nostrils daily as needed for allergies., Disp: , Rfl:    LORazepam (ATIVAN) 0.5 MG tablet, Take 0.5 mg by mouth every 4 (four) hours as needed., Disp: , Rfl:    morphine PF (ASTRAMORPH) 0.5 mg/mL SOLN, Take 0.25 mg by mouth every 6 (six) hours as needed., Disp: , Rfl:    QUEtiapine (SEROQUEL) 25 MG tablet, Take 25 mg by mouth at bedtime., Disp: , Rfl:    senna (SENOKOT) 8.6 MG tablet, Take 2 tablets (17.2 mg total) by mouth 2 (two) times daily. May crush, mix with water and give sublingually if needed., Disp: 28 tablet, Rfl: 0   STIOLTO RESPIMAT 2.5-2.5 MCG/ACT AERS, Inhale 2 each into the lungs daily., Disp: , Rfl:   sodium chloride Stopped (03/07/23 0023)   pantoprazole 8 mg/hr (03/07/23 0824)    acetaminophen **OR** acetaminophen, albuterol, LORazepam, ondansetron **OR** ondansetron (ZOFRAN) IV   Objective   Vitals:   03/07/23 0349 03/07/23 0400 03/07/23 0430 03/07/23 0700  BP: (!) 126/54 (!) 127/102 (!) 142/59 (!) 120/47  Pulse:  (!) 46 (!) 50 (!) 54  Resp:  14 16 16   Temp:      TempSrc:      SpO2:  100% 100% 100%     Physical Exam Vitals and nursing note reviewed.  Constitutional:      General: She is not in acute distress.    Appearance: She is ill-appearing (chronically ill and frail appearing). She is not toxic-appearing or diaphoretic.  HENT:     Head: Normocephalic and atraumatic.     Nose: Nose normal.     Mouth/Throat:     Mouth: Mucous membranes are moist.     Pharynx: Oropharynx is clear.  Eyes:     General: No scleral icterus.    Extraocular Movements: Extraocular movements intact.  Pulmonary:     Effort: Pulmonary effort is normal.     Breath sounds: Normal breath sounds.     Comments: No audible wheezing. Musculoskeletal:     Cervical back: Neck supple.  Skin:    General: Skin is warm  and dry.  Coloration: Skin is not pale.  Neurological:     Mental Status: She is alert.     Comments: Patient cannot tell me why she is here. Physical exam deferred as patient "does not want to be touched." Offered to assist her with fixing her nasal cannula and was swatted away. She says she will do it herself, but does not adjust it despite multiple suggestions to     Laboratory Data Recent Labs  Lab 03/06/23 1810 03/07/23 0701  WBC 4.7 4.5  HGB 6.6* 8.2*  HCT 22.0* 25.2*  PLT 187 177   Recent Labs  Lab 03/06/23 1810 03/07/23 0701  NA 137 135  K 3.8 3.9  CL 110 108  CO2 20* 23  BUN 25* 24*  CALCIUM 8.2* 8.6*  GLUCOSE 93 82   No results for input(s): "INR" in the last 168 hours.  No results for input(s): "LIPASE" in the last 72 hours.      Imaging Studies: DG Chest 2 View  Result Date: 03/06/2023 CLINICAL DATA:  Chest pain EXAM: CHEST - 2 VIEW COMPARISON:  Radiograph 11/08/2016 FINDINGS: Normal cardiac silhouette. Lungs are hyperinflated. Mild airspace disease noted over the posterior lung base on lateral projection. Hyperinflated lungs. No pneumothorax. IMPRESSION: 1. potential lower lobe pneumonia. 2. No pneumothorax or pulmonary edema. 3. Hyperinflated lungs. Electronically Signed   By: Genevive Bi M.D.   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  Transfusion and resuscitation as per primary team Avoid frequent lab draws to prevent lab induced anemia Supportive care and antiemetics as per primary team Maintain two sites IV access Avoid nsaids Monitor for GIB.  Should the patient develop signs of GI bleeding and hemodynamic instability, recommend stat CTA if family desires intervention despite hospice status  GI to sign off. Available as needed. Please do not hesitate to call regarding questions or concerns or patient status change.  Management of other medical comorbidities as per primary team  I personally performed the service.  Thank you for allowing [place] to participate in this patient's care. Please don't hesitate to call if any questions or concerns arise.   Jaynie Collins, DO [facility]  Portions of the record may have been created with voice recognition software. Occasional wrong-word or 'sound-a-like' substitutions may have occurred due to the inherent limitations of voice recognition software.  Read the chart carefully and recognize, using context, where substitutions may have occurred.

## 2023-03-08 LAB — ECHOCARDIOGRAM COMPLETE
AR max vel: 3.32 cm2
AV Area VTI: 3.26 cm2
AV Area mean vel: 3.52 cm2
AV Mean grad: 4 mmHg
AV Peak grad: 8 mmHg
Ao pk vel: 1.41 m/s
Area-P 1/2: 2.39 cm2
MV VTI: 3.2 cm2
P 1/2 time: 659 msec
S' Lateral: 2.7 cm

## 2023-03-08 LAB — TYPE AND SCREEN
ABO/RH(D): O POS
Antibody Screen: NEGATIVE
Unit division: 0

## 2023-03-08 LAB — BPAM RBC
Blood Product Expiration Date: 202410032359
ISSUE DATE / TIME: 202409010050
Unit Type and Rh: 5100

## 2023-08-07 DEATH — deceased
